# Patient Record
Sex: Female | Born: 2000 | Race: White | Hispanic: No | Marital: Single | Smoking: Never smoker
Health system: Southern US, Community
[De-identification: ages and names within clinical notes are randomized; demographics above are authoritative.]

## PROBLEM LIST (undated history)

## (undated) DIAGNOSIS — T7840XA Allergy, unspecified, initial encounter: Secondary | ICD-10-CM

## (undated) DIAGNOSIS — T8859XA Other complications of anesthesia, initial encounter: Secondary | ICD-10-CM

## (undated) DIAGNOSIS — G43909 Migraine, unspecified, not intractable, without status migrainosus: Secondary | ICD-10-CM

## (undated) HISTORY — DX: Allergy, unspecified, initial encounter: T78.40XA

## (undated) HISTORY — DX: Migraine, unspecified, not intractable, without status migrainosus: G43.909

---
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
## 2021-05-12 ENCOUNTER — Other Ambulatory Visit: Payer: Self-pay | Admitting: Family

## 2021-08-11 ENCOUNTER — Other Ambulatory Visit: Payer: Self-pay | Admitting: Internal Medicine

## 2021-08-11 NOTE — Telephone Encounter (Signed)
Requested Prescriptions  ?Pending Prescriptions Disp Refills  ?? omeprazole (PRILOSEC) 20 MG capsule [Pharmacy Med Name: OMEPRAZOLE DR 20 MG CAPSULE] 180 capsule 0  ?  Sig: TAKE 1 CAPSULE BY MOUTH TWICE A DAY  ?  ? There is no refill protocol information for this order  ?  ? ? ?

## 2021-10-28 ENCOUNTER — Other Ambulatory Visit: Payer: Self-pay | Admitting: Family

## 2021-11-10 ENCOUNTER — Other Ambulatory Visit: Payer: Self-pay | Admitting: Internal Medicine

## 2021-11-11 NOTE — Telephone Encounter (Signed)
Requested Prescriptions  Pending Prescriptions Disp Refills  . omeprazole (PRILOSEC) 20 MG capsule [Pharmacy Med Name: OMEPRAZOLE DR 20 MG CAPSULE] 180 capsule 0    Sig: TAKE 1 CAPSULE BY MOUTH TWICE A DAY     Gastroenterology: Proton Pump Inhibitors Failed - 11/10/2021  3:53 PM      Failed - Valid encounter within last 12 months    Recent Outpatient Visits          9 years ago Pain of sternum   [facility] at [facility], Gwenlyn Found, MD   10 years ago Impetigo   [facility] at [facility], Tessa Lerner, MD      Future Appointments            In 2 weeks Baity, Salvadore Oxford, NP [facility], [facility]           Patient must keep upcoming appointment for further refills.

## 2021-11-18 ENCOUNTER — Other Ambulatory Visit: Payer: Self-pay | Admitting: Internal Medicine

## 2021-11-19 NOTE — Telephone Encounter (Signed)
rx was sent to pharmacy on 11/11/21 #30/0 Requested Prescriptions  Pending Prescriptions Disp Refills  . omeprazole (PRILOSEC) 20 MG capsule [Pharmacy Med Name: OMEPRAZOLE DR 20 MG CAPSULE] 30 capsule 0    Sig: TAKE 1 CAPSULE BY MOUTH TWICE A DAY     Gastroenterology: Proton Pump Inhibitors Failed - 11/18/2021  2:31 PM      Failed - Valid encounter within last 12 months    Recent Outpatient Visits          9 years ago Pain of sternum   Primary Care at [facility], Gwenlyn Found, MD   10 years ago Impetigo   [facility] at [facility], Tessa Lerner, MD      Future Appointments            In 6 days Baity, Salvadore Oxford, NP [facility], [facility]

## 2021-11-25 ENCOUNTER — Encounter: Payer: Self-pay | Admitting: Internal Medicine

## 2021-11-25 ENCOUNTER — Ambulatory Visit (INDEPENDENT_AMBULATORY_CARE_PROVIDER_SITE_OTHER): Payer: No Typology Code available for payment source | Admitting: Internal Medicine

## 2021-11-25 VITALS — BP 128/86 | HR 75 | Temp 98.5°F | Ht <= 58 in | Wt 141.0 lb

## 2021-11-25 DIAGNOSIS — R5383 Other fatigue: Secondary | ICD-10-CM

## 2021-11-25 DIAGNOSIS — R4689 Other symptoms and signs involving appearance and behavior: Secondary | ICD-10-CM

## 2021-11-25 DIAGNOSIS — E663 Overweight: Secondary | ICD-10-CM | POA: Diagnosis not present

## 2021-11-25 DIAGNOSIS — R41840 Attention and concentration deficit: Secondary | ICD-10-CM

## 2021-11-25 DIAGNOSIS — F419 Anxiety disorder, unspecified: Secondary | ICD-10-CM | POA: Diagnosis not present

## 2021-11-25 DIAGNOSIS — G43C1 Periodic headache syndromes in child or adult, intractable: Secondary | ICD-10-CM

## 2021-11-25 DIAGNOSIS — K200 Eosinophilic esophagitis: Secondary | ICD-10-CM

## 2021-11-25 DIAGNOSIS — E6609 Other obesity due to excess calories: Secondary | ICD-10-CM | POA: Insufficient documentation

## 2021-11-25 DIAGNOSIS — Z0001 Encounter for general adult medical examination with abnormal findings: Secondary | ICD-10-CM | POA: Diagnosis not present

## 2021-11-25 DIAGNOSIS — Z6829 Body mass index (BMI) 29.0-29.9, adult: Secondary | ICD-10-CM

## 2021-11-25 DIAGNOSIS — F32A Depression, unspecified: Secondary | ICD-10-CM

## 2021-11-25 MED ORDER — FLUOXETINE HCL 10 MG PO CAPS: 10.0000 mg | ORAL_CAPSULE | Freq: Every day | ORAL | 2 refills | Status: DC

## 2021-11-25 MED ORDER — NORGESTIM-ETH ESTRAD TRIPHASIC 0.18/0.215/0.25 MG-35 MCG PO TABS: 1.0000 | ORAL_TABLET | Freq: Every day | ORAL | 1 refills | Status: DC

## 2021-11-25 NOTE — Progress Notes (Signed)
Subjective:    Patient ID: Michele Dunlap, female    DOB: 07-03-2000, 21 y.o.   MRN: [id number]  HPI  Patient presents to clinic today for her annual exam.  She is also due to follow-up chronic conditions.  Migraines: She thinks these are being triggered by her periods.  They are worse around her menstrual cycle.  She is not currently taking any medications for this.  Eosinophilic esophagitis: She reports chronic burning sensation in her abdomen.  She reports she is taking omeprazole but that it is not effective.  She would like referral to GI.  She also reports persistent anxiety, depression, compulsive behaviors.  She is concerned that she may have ADD due to lack of motivation, inattention and difficulty focusing.  She was taking Hydroxyzine as needed but is not currently taking this.  She is not currently seeing a therapist.  She denies SI/HI.  Flu: 01/2021 Tetanus: < 10 years ago COVID: never Dentist: biannually  Diet: She does eat meat. She consumes fruits and veggies. She does eat some fried foods. She drinks mostly water. Exercise: Gym 4-5 days per week  Review of Systems     Past Medical History:  Diagnosis Date   Allergy    Migraine     Current Outpatient Medications  Medication Sig Dispense Refill   amitriptyline (ELAVIL) 10 MG tablet TAKE 1 TABLET BY MOUTH EVERY DAY AT BEDTIME AS NEEDED FOR SLEEP 90 tablet 0   hydrOXYzine (ATARAX/VISTARIL) 25 MG tablet SMARTSIG:0.5-1 Tablet(s) By Mouth PRN PRN     [facility] 1/35 1-35 MG-MCG tablet TAKE 1 TABLET BY MOUTH EVERY DAY 84 tablet 3   omeprazole (PRILOSEC) 20 MG capsule TAKE 1 CAPSULE BY MOUTH TWICE A DAY 30 capsule 0   triamcinolone (KENALOG) 0.1 % Apply 1 application topically 2 (two) times daily. 30 g 0   No current facility-administered medications for this visit.    No Known Allergies  Family History  Problem Relation Age of Onset   Hypertension Maternal Grandmother    Hypertension Maternal Grandfather     Hypertension Paternal Grandmother    Hypertension Paternal Grandfather     Social History   Socioeconomic History   Marital status: Single    Spouse name: Not on file   Number of children: Not on file   Years of education: Not on file   Highest education level: Not on file  Occupational History   Not on file  Tobacco Use   Smoking status: Never   Smokeless tobacco: Never  Vaping Use   Vaping Use: Never used  Substance and Sexual Activity   Alcohol use: Never   Drug use: Never   Sexual activity: Yes  Other Topics Concern   Not on file  Social History Narrative   Leathia is currently in 11th grade at [employer]. She lives at home with mother and sister. She has 3 dogs. She likes to dance.   Social Determinants of Health   Financial Resource Strain: Not on file  Food Insecurity: Not on file  Transportation Needs: Not on file  Physical Activity: Not on file  Stress: Not on file  Social Connections: Not on file  Intimate Partner Violence: Not on file     Constitutional: Patient reports intermittent headaches, faitgue.  Denies fever, malaise, or abrupt weight changes.  HEENT: Denies eye pain, eye redness, ear pain, ringing in the ears, wax buildup, runny nose, nasal congestion, bloody nose, or sore throat. Respiratory: Denies difficulty breathing, shortness of breath,  cough or sputum production.   Cardiovascular: Denies chest pain, chest tightness, palpitations or swelling in the hands or feet.  Gastrointestinal: Pt report burning sensation in abdomen. Denies bloating, constipation, diarrhea or blood in the stool.  GU: Denies urgency, frequency, pain with urination, burning sensation, blood in urine, odor or discharge. Musculoskeletal: Denies decrease in range of motion, difficulty with gait, muscle pain or joint pain and swelling.  Skin: Denies redness, rashes, lesions or ulcercations.  Neurological: Pt reports inattention. Denies dizziness, difficulty with memory,  difficulty with speech or problems with balance and coordination.  Psych: Patient has a history of anxiety and depression, compulsive behavior.  Denies anxiety, SI/HI.  No other specific complaints in a complete review of systems (except as listed in HPI above).  Objective:   Physical Exam  BP 128/86 (BP Location: Left Arm, Patient Position: Sitting, Cuff Size: Normal)   Pulse 75   Temp 98.5 F (36.9 C) (Oral)   Ht $R'4\' 10"'Wx$  (1.473 m)   Wt 141 lb (64 kg)   SpO2 98%   BMI 29.47 kg/m   Wt Readings from Last 3 Encounters:  07/01/20 135 lb (61.2 kg) (66 %, Z= 0.41)*  03/17/20 134 lb (60.8 kg) (66 %, Z= 0.41)*  01/24/20 136 lb 6.4 oz (61.9 kg) (70 %, Z= 0.52)*   * Growth percentiles are based on CDC (Girls, 2-20 Years) data.    General: Appears her stated age, overweight, in NAD. Skin: Warm, dry and intact. No rashes, lesions or ulcerations noted. HEENT: Head: normal shape and size; Eyes: sclera white, no icterus, conjunctiva pink, PERRLA and EOMs intact;  Neck:  Neck supple, trachea midline. No masses, lumps or thyromegaly present.  Cardiovascular: Normal rate and rhythm. S1,S2 noted.  No murmur, rubs or gallops noted. No JVD or BLE edema Pulmonary/Chest: Normal effort and positive vesicular breath sounds. No respiratory distress. No wheezes, rales or ronchi noted.  Abdomen: Soft and nontender. Normal bowel sounds.  Musculoskeletal: strength 5/5 BUE/BLE. No difficulty with gait.  Neurological: Alert and oriented. Cranial nerves II-XII grossly intact. Coordination normal.  Psychiatric: Mood and affect normal. Behavior is normal. Judgment and thought content normal.    BMET    Component Value Date/Time   NA 135 02/02/2018 0039   K 3.6 02/02/2018 0039   CL 106 02/02/2018 0039   CO2 17 (L) 02/02/2018 0039   GLUCOSE 114 (H) 02/02/2018 0039   BUN 10 02/02/2018 0039   CREATININE 0.75 02/02/2018 0039   CALCIUM 8.7 (L) 02/02/2018 0039   GFRNONAA NOT CALCULATED 02/02/2018 0039    GFRAA NOT CALCULATED 02/02/2018 0039    Lipid Panel  No results found for: "CHOL", "TRIG", "HDL", "CHOLHDL", "VLDL", "LDLCALC"  CBC    Component Value Date/Time   WBC 19.7 (H) 02/02/2018 0039   RBC 4.73 02/02/2018 0039   HGB 13.7 02/02/2018 0039   HCT 41.3 02/02/2018 0039   PLT 192 02/02/2018 0039   MCV 87.3 02/02/2018 0039   MCH 29.0 02/02/2018 0039   MCHC 33.2 02/02/2018 0039   RDW 12.4 02/02/2018 0039   LYMPHSABS 1.8 02/02/2018 0039   MONOABS 1.7 (H) 02/02/2018 0039   EOSABS 0.1 02/02/2018 0039   BASOSABS 0.1 02/02/2018 0039    Hgb A1C No results found for: "HGBA1C"         Assessment & Plan:   Preventative Health Maintenance:  Encouraged her to get a flu shot in the fall Tetanus likely UTD given age Encouraged her to get a COVID-vaccine We will  start screening for cervical cancer 22 Encouraged her to consume a balanced diet and exercise regimen Advised her to see an eye doctor and dentist annually We will check CBC, c-Met, lipid, A1c,   Fatigue:  Will check TSH, Vit D and B12  Compulsive Behaviors, Inattention:  She will call [place] attention specialist for ADD testing  RTC in 6 months, follow-up chronic conditions [name], NP

## 2021-11-25 NOTE — Assessment & Plan Note (Signed)
We will trial fluoxetine 10 mg daily Support offered

## 2021-11-25 NOTE — Patient Instructions (Signed)

## 2021-11-25 NOTE — Assessment & Plan Note (Signed)
Encourage diet and exercise for weight loss 

## 2021-11-26 ENCOUNTER — Encounter: Payer: Self-pay | Admitting: Internal Medicine

## 2021-11-26 LAB — LIPID PANEL
Cholesterol: 190 mg/dL (ref <200)
HDL: 60 mg/dL (ref >=50)
LDL Cholesterol (Calc): 109 mg/dL (calc) — ABNORMAL HIGH
Non-HDL Cholesterol (Calc): 130 mg/dL (calc) — ABNORMAL HIGH (ref <130)
Total CHOL/HDL Ratio: 3.2 (calc) (ref <5.0)
Triglycerides: 100 mg/dL (ref <150)

## 2021-11-26 LAB — COMPLETE METABOLIC PANEL WITH GFR
AG Ratio: 1.6 (calc) (ref 1.0–2.5)
ALT: 13 U/L (ref 6–29)
AST: 17 U/L (ref 10–30)
Albumin: 4.6 g/dL (ref 3.6–5.1)
Alkaline phosphatase (APISO): 83 U/L (ref 31–125)
BUN: 11 mg/dL (ref 7–25)
CO2: 23 mmol/L (ref 20–32)
Calcium: 9.6 mg/dL (ref 8.6–10.2)
Chloride: 105 mmol/L (ref 98–110)
Creat: 0.73 mg/dL (ref 0.50–0.96)
Globulin: 2.8 g/dL (calc) (ref 1.9–3.7)
Glucose, Bld: 93 mg/dL (ref 65–99)
Potassium: 4.5 mmol/L (ref 3.5–5.3)
Sodium: 138 mmol/L (ref 135–146)
Total Bilirubin: 0.4 mg/dL (ref 0.2–1.2)
Total Protein: 7.4 g/dL (ref 6.1–8.1)
eGFR: 121 mL/min/{1.73_m2} (ref >=60)

## 2021-11-26 LAB — VITAMIN B12: Vitamin B-12: 274 pg/mL (ref 200–1100)

## 2021-11-26 LAB — TSH: TSH: 2.55 mIU/L

## 2021-11-26 LAB — CBC
HCT: 41.7 % (ref 35.0–45.0)
Hemoglobin: 13.7 g/dL (ref 11.7–15.5)
MCH: 29.1 pg (ref 27.0–33.0)
MCHC: 32.9 g/dL (ref 32.0–36.0)
MCV: 88.5 fL (ref 80.0–100.0)
MPV: 9.8 fL (ref 7.5–12.5)
Platelets: 305 10*3/uL (ref 140–400)
RBC: 4.71 10*6/uL (ref 3.80–5.10)
RDW: 12.3 % (ref 11.0–15.0)
WBC: 8.8 10*3/uL (ref 3.8–10.8)

## 2021-11-26 LAB — IRON,TIBC AND FERRITIN PANEL
%SAT: 15 % (calc) — ABNORMAL LOW (ref 16–45)
Ferritin: 39 ng/mL (ref 16–154)
Iron: 64 ug/dL (ref 40–190)
TIBC: 420 mcg/dL (calc) (ref 250–450)

## 2021-11-26 LAB — HEMOGLOBIN A1C
Hgb A1c MFr Bld: 4.9 % of total Hgb (ref <5.7)
Mean Plasma Glucose: 94 mg/dL
eAG (mmol/L): 5.2 mmol/L

## 2021-11-26 LAB — VITAMIN D 25 HYDROXY (VIT D DEFICIENCY, FRACTURES): Vit D, 25-Hydroxy: 34 ng/mL (ref 30–100)

## 2021-12-01 ENCOUNTER — Ambulatory Visit
Admission: EM | Admit: 2021-12-01 | Discharge: 2021-12-01 | Disposition: A | Discharge status: Home / Self Care | Payer: No Typology Code available for payment source | Attending: Family Medicine | Admitting: Family Medicine

## 2021-12-01 ENCOUNTER — Other Ambulatory Visit: Payer: Self-pay

## 2021-12-01 ENCOUNTER — Encounter: Payer: Self-pay | Admitting: Emergency Medicine

## 2021-12-01 DIAGNOSIS — L03039 Cellulitis of unspecified toe: Secondary | ICD-10-CM | POA: Diagnosis not present

## 2021-12-01 NOTE — Discharge Instructions (Addendum)
Perform warm soapy water soaks 4-6 times daily.  Apply gentle pressure to be sure that all the green discharge is removed from the toe.  Apply a small amount of topical antibiotics (provided).

## 2021-12-01 NOTE — ED Triage Notes (Signed)
Pt c/o left great toe pain. Started about a week ago. Pt has an ingrown toe nail. She states it is draining.

## 2021-12-01 NOTE — ED Provider Notes (Signed)
[facility]    CSN: [id number] Arrival date & time: 12/01/21  1335      History   Chief Complaint Chief Complaint  Patient presents with   Toe Pain    Left great toe     HPI Michele Dunlap is a 21 y.o. female.   HPI  Michele Dunlap here for left great toe pain that started about a week ago.  States that she tried to cut her nail down and think it is an ingrown toenail.  Says that there is some drainage from the area.  Pain worse with palpation.  She has not had any fevers or chills.  No nausea or vomiting.  No other symptoms or concerns today.   Past Medical History:  Diagnosis Date   Allergy    Migraine     Patient Active Problem List   Diagnosis Date Noted   Overweight with body mass index (BMI) of 29 to 29.9 in adult 11/25/2021   Anxiety and depression 03/18/2020   Eosinophilic esophagitis 02/12/2018   Migraine 02/27/2012    History reviewed. No pertinent surgical history.  OB History   No obstetric history on file.      Home Medications    Prior to Admission medications   Medication Sig Start Date End Date Taking? Authorizing Provider  FLUoxetine (PROZAC) 10 MG capsule Take 1 capsule (10 mg total) by mouth daily. 11/25/21  Yes Baity, Salvadore Oxford, NP  Norgestimate-Ethinyl Estradiol Triphasic (TRI-SPRINTEC) 0.18/0.215/0.25 MG-35 MCG tablet Take 1 tablet by mouth daily. 11/25/21  Yes Baity, Salvadore Oxford, NP    Family History Family History  Problem Relation Age of Onset   Hypertension Mother    Healthy Father    Down syndrome Sister    Hypertension Maternal Grandmother    Hypertension Maternal Grandfather    Hypertension Paternal Grandmother    Hypertension Paternal Grandfather     Social History Social History   Tobacco Use   Smoking status: Never   Smokeless tobacco: Never  Vaping Use   Vaping Use: Never used  Substance Use Topics   Alcohol use: Never   Drug use: Never     Allergies   Patient has no known allergies.   Review of  Systems Review of Systems : :negative unless otherwise stated in HPI.      Physical Exam Triage Vital Signs ED Triage Vitals  Enc Vitals Group     BP 12/01/21 1356 112/75     Pulse Rate 12/01/21 1356 74     Resp 12/01/21 1356 14     Temp 12/01/21 1356 98.4 F (36.9 C)     Temp Source 12/01/21 1356 Oral     SpO2 12/01/21 1356 97 %     Weight 12/01/21 1355 141 lb 1.5 oz (64 kg)     Height 12/01/21 1355 4\' 10"  (1.473 m)     Head Circumference --      Peak Flow --      Pain Score 12/01/21 1354 4     Pain Loc --      Pain Edu? --      Excl. in GC? --    No data found.  Updated Vital Signs BP 112/75 (BP Location: Left Arm)   Pulse 74   Temp 98.4 F (36.9 C) (Oral)   Resp 14   Ht 4\' 10"  (1.473 m)   Wt 64 kg   LMP 11/11/2021 (Approximate)   SpO2 97%   BMI 29.49 kg/m   Visual Acuity  Right Eye Distance:   Left Eye Distance:   Bilateral Distance:    Right Eye Near:   Left Eye Near:    Bilateral Near:     Physical Exam  GEN: pleasant well appearing female, in no acute distress  CV: regular rate  RESP: no increased work of breathing MSK: left great toe good ROM SKIN: warm, dry, erythematous at lateral border of left great toe, green purulent discharge expressed, curved nails on great toe  UC Treatments / Results  Labs (all labs ordered are listed, but only abnormal results are displayed) Labs Reviewed - No data to display  EKG   Radiology No results found.  Procedures Incision and Drainage  Date/Time: 12/01/2021 8:50 PM  Performed by: Katha Cabal, DO Authorized by: Katha Cabal, DO   Consent:    Consent obtained:  Verbal   Consent given by:  Patient   Risks, benefits, and alternatives were discussed: yes     Risks discussed:  Bleeding, pain and infection   Alternatives discussed:  No treatment and alternative treatment Location:    Type:  Fluid collection   Location:  Lower extremity   Lower extremity location:  Toe   Toe location:  L big  toe Pre-procedure details:    Skin preparation:  Antiseptic wash Sedation:    Sedation type:  None Anesthesia:    Anesthesia method:  None Procedure type:    Complexity:  Simple Procedure details:    Incision types:  Stab incision   Drainage:  Purulent   Drainage amount:  Scant   Packing materials:  None Post-procedure details:    Procedure completion:  Tolerated   Medications Ordered in UC Medications - No data to display  Initial Impression / Assessment and Plan / UC Course  I have reviewed the triage vital signs and the nursing notes.  Pertinent labs & imaging results that were available during my care of the patient were reviewed by me and considered in my medical decision making (see chart for details).     Paronychia Pt removed her own ingrown toenail prior to arrival. Exam consistent with paronychia.  I&D performed and patient tolerated well.  Topical antibiotics applied.  Patient to perform warm soaks apply topical antibiotics several times a day.  Red flag symptoms discussed.  Return precautions provided  Discussed MDM, treatment plan and plan for follow-up with patient who agrees with plan.   Final Clinical Impressions(s) / UC Diagnoses   Final diagnoses:  Paronychia of great toe     Discharge Instructions      Perform warm soapy water soaks 4-6 times daily.  Apply gentle pressure to be sure that all the green discharge is removed from the toe.  Apply a small amount of topical antibiotics (provided).       ED Prescriptions   None    PDMP not reviewed this encounter.   Katha Cabal, DO 12/01/21 2055

## 2021-12-02 ENCOUNTER — Telehealth (INDEPENDENT_AMBULATORY_CARE_PROVIDER_SITE_OTHER): Payer: No Typology Code available for payment source | Admitting: Internal Medicine

## 2021-12-02 ENCOUNTER — Encounter: Payer: Self-pay | Admitting: Internal Medicine

## 2021-12-02 DIAGNOSIS — Z6829 Body mass index (BMI) 29.0-29.9, adult: Secondary | ICD-10-CM | POA: Diagnosis not present

## 2021-12-02 DIAGNOSIS — E663 Overweight: Secondary | ICD-10-CM | POA: Diagnosis not present

## 2021-12-02 NOTE — Patient Instructions (Signed)
Calorie Counting for Weight Loss Calories are units of energy. Your body needs a certain number of calories from food to keep going throughout the day. When you eat or drink more calories than your body needs, your body stores the extra calories mostly as fat. When you eat or drink fewer calories than your body needs, your body burns fat to get the energy it needs. Calorie counting means keeping track of how many calories you eat and drink each day. Calorie counting can be helpful if you need to lose weight. If you eat fewer calories than your body needs, you should lose weight. Ask your health care provider what a healthy weight is for you. For calorie counting to work, you will need to eat the right number of calories each day to lose a healthy amount of weight per week. A dietitian can help you figure out how many calories you need in a day and will suggest ways to reach your calorie goal. A healthy amount of weight to lose each week is usually 1-2 lb (0.5-0.9 kg). This usually means that your daily calorie intake should be reduced by 500-750 calories. Eating 1,200-1,500 calories a day can help most women lose weight. Eating 1,500-1,800 calories a day can help most men lose weight. What do I need to know about calorie counting? Work with your health care provider or dietitian to determine how many calories you should get each day. To meet your daily calorie goal, you will need to: Find out how many calories are in each food that you would like to eat. Try to do this before you eat. Decide how much of the food you plan to eat. Keep a food log. Do this by writing down what you ate and how many calories it had. To successfully lose weight, it is important to balance calorie counting with a healthy lifestyle that includes regular activity. Where do I find calorie information?  The number of calories in a food can be found on a Nutrition Facts label. If a food does not have a Nutrition Facts label, try  to look up the calories online or ask your dietitian for help. Remember that calories are listed per serving. If you choose to have more than one serving of a food, you will have to multiply the calories per serving by the number of servings you plan to eat. For example, the label on a package of bread might say that a serving size is 1 slice and that there are 90 calories in a serving. If you eat 1 slice, you will have eaten 90 calories. If you eat 2 slices, you will have eaten 180 calories. How do I keep a food log? After each time that you eat, record the following in your food log as soon as possible: What you ate. Be sure to include toppings, sauces, and other extras on the food. How much you ate. This can be measured in cups, ounces, or number of items. How many calories were in each food and drink. The total number of calories in the food you ate. Keep your food log near you, such as in a pocket-sized notebook or on an app or website on your mobile phone. Some programs will calculate calories for you and show you how many calories you have left to meet your daily goal. What are some portion-control tips? Know how many calories are in a serving. This will help you know how many servings you can have of a certain   food. Use a measuring cup to measure serving sizes. You could also try weighing out portions on a kitchen scale. With time, you will be able to estimate serving sizes for some foods. Take time to put servings of different foods on your favorite plates or in your favorite bowls and cups so you know what a serving looks like. Try not to eat straight from a food's packaging, such as from a bag or box. Eating straight from the package makes it hard to see how much you are eating and can lead to overeating. Put the amount you would like to eat in a cup or on a plate to make sure you are eating the right portion. Use smaller plates, glasses, and bowls for smaller portions and to prevent  overeating. Try not to multitask. For example, avoid watching TV or using your computer while eating. If it is time to eat, sit down at a table and enjoy your food. This will help you recognize when you are full. It will also help you be more mindful of what and how much you are eating. What are tips for following this plan? Reading food labels Check the calorie count compared with the serving size. The serving size may be smaller than what you are used to eating. Check the source of the calories. Try to choose foods that are high in protein, fiber, and vitamins, and low in saturated fat, trans fat, and sodium. Shopping Read nutrition labels while you shop. This will help you make healthy decisions about which foods to buy. Pay attention to nutrition labels for low-fat or fat-free foods. These foods sometimes have the same number of calories or more calories than the full-fat versions. They also often have added sugar, starch, or salt to make up for flavor that was removed with the fat. Make a grocery list of lower-calorie foods and stick to it. Cooking Try to cook your favorite foods in a healthier way. For example, try baking instead of frying. Use low-fat dairy products. Meal planning Use more fruits and vegetables. One-half of your plate should be fruits and vegetables. Include lean proteins, such as chicken, turkey, and fish. Lifestyle Each week, aim to do one of the following: 150 minutes of moderate exercise, such as walking. 75 minutes of vigorous exercise, such as running. General information Know how many calories are in the foods you eat most often. This will help you calculate calorie counts faster. Find a way of tracking calories that works for you. Get creative. Try different apps or programs if writing down calories does not work for you. What foods should I eat?  Eat nutritious foods. It is better to have a nutritious, high-calorie food, such as an avocado, than a food with  few nutrients, such as a bag of potato chips. Use your calories on foods and drinks that will fill you up and will not leave you hungry soon after eating. Examples of foods that fill you up are nuts and nut butters, vegetables, lean proteins, and high-fiber foods such as whole grains. High-fiber foods are foods with more than 5 g of fiber per serving. Pay attention to calories in drinks. Low-calorie drinks include water and unsweetened drinks. The items listed above may not be a complete list of foods and beverages you can eat. Contact a dietitian for more information. What foods should I limit? Limit foods or drinks that are not good sources of vitamins, minerals, or protein or that are high in unhealthy fats. These   include: Candy. Other sweets. Sodas, specialty coffee drinks, alcohol, and juice. The items listed above may not be a complete list of foods and beverages you should avoid. Contact a dietitian for more information. How do I count calories when eating out? Pay attention to portions. Often, portions are much larger when eating out. Try these tips to keep portions smaller: Consider sharing a meal instead of getting your own. If you get your own meal, eat only half of it. Before you start eating, ask for a container and put half of your meal into it. When available, consider ordering smaller portions from the menu instead of full portions. Pay attention to your food and drink choices. Knowing the way food is cooked and what is included with the meal can help you eat fewer calories. If calories are listed on the menu, choose the lower-calorie options. Choose dishes that include vegetables, fruits, whole grains, low-fat dairy products, and lean proteins. Choose items that are boiled, broiled, grilled, or steamed. Avoid items that are buttered, battered, fried, or served with cream sauce. Items labeled as crispy are usually fried, unless stated otherwise. Choose water, low-fat milk,  unsweetened iced tea, or other drinks without added sugar. If you want an alcoholic beverage, choose a lower-calorie option, such as a glass of wine or light beer. Ask for dressings, sauces, and syrups on the side. These are usually high in calories, so you should limit the amount you eat. If you want a salad, choose a garden salad and ask for grilled meats. Avoid extra toppings such as bacon, cheese, or fried items. Ask for the dressing on the side, or ask for olive oil and vinegar or lemon to use as dressing. Estimate how many servings of a food you are given. Knowing serving sizes will help you be aware of how much food you are eating at restaurants. Where to find more information Centers for Disease Control and Prevention: www.cdc.gov U.[place]. Department of Agriculture: myplate.gov Summary Calorie counting means keeping track of how many calories you eat and drink each day. If you eat fewer calories than your body needs, you should lose weight. A healthy amount of weight to lose per week is usually 1-2 lb (0.5-0.9 kg). This usually means reducing your daily calorie intake by 500-750 calories. The number of calories in a food can be found on a Nutrition Facts label. If a food does not have a Nutrition Facts label, try to look up the calories online or ask your dietitian for help. Use smaller plates, glasses, and bowls for smaller portions and to prevent overeating. Use your calories on foods and drinks that will fill you up and not leave you hungry shortly after a meal. This information is not intended to replace advice given to you by your health care provider. Make sure you discuss any questions you have with your health care provider. Document Revised: 05/23/2019 Document Reviewed: 05/23/2019 Elsevier Patient Education  2023 Elsevier Inc.  

## 2021-12-02 NOTE — Progress Notes (Signed)
Virtual Visit via Video Note  I connected with Michele Dunlap on 12/02/21 at 11:20 AM EDT by a video enabled telemedicine application and verified that I am speaking with the correct person using two identifiers.  Location: Patient: Home Provider: Office  Persons participating in this video call: [name], NP and Michele Dunlap.   I discussed the limitations of evaluation and management by telemedicine and the availability of in person appointments. The patient expressed understanding and agreed to proceed.  History of Present Illness:  Patient presents to clinic today requesting medication for weight loss.  Her current weight is 141 pounds with a BMI of 29.49.  She has been working out at the gym 2 to 3 days a week.  She has been trying to consume smaller portions.  Past Medical History:  Diagnosis Date   Allergy    Migraine     Current Outpatient Medications  Medication Sig Dispense Refill   FLUoxetine (PROZAC) 10 MG capsule Take 1 capsule (10 mg total) by mouth daily. 30 capsule 2   Norgestimate-Ethinyl Estradiol Triphasic (TRI-SPRINTEC) 0.18/0.215/0.25 MG-35 MCG tablet Take 1 tablet by mouth daily. 84 tablet 1   No current facility-administered medications for this visit.    No Known Allergies  Family History  Problem Relation Age of Onset   Hypertension Mother    Healthy Father    Down syndrome Sister    Hypertension Maternal Grandmother    Hypertension Maternal Grandfather    Hypertension Paternal Grandmother    Hypertension Paternal Grandfather     Social History   Socioeconomic History   Marital status: Single    Spouse name: Not on file   Number of children: Not on file   Years of education: Not on file   Highest education level: Not on file  Occupational History   Not on file  Tobacco Use   Smoking status: Never   Smokeless tobacco: Never  Vaping Use   Vaping Use: Never used  Substance and Sexual Activity   Alcohol use: Never   Drug use: Never    Sexual activity: Yes  Other Topics Concern   Not on file  Social History Narrative   Jalasia is currently in 11th grade at [employer]. She lives at home with mother and sister. She has 3 dogs. She likes to dance.   Social Determinants of Health   Financial Resource Strain: Not on file  Food Insecurity: Not on file  Transportation Needs: Not on file  Physical Activity: Not on file  Stress: Not on file  Social Connections: Not on file  Intimate Partner Violence: Not on file     Constitutional: Denies fever, malaise, fatigue, headache or abrupt weight changes.  Respiratory: Denies difficulty breathing, shortness of breath, cough or sputum production.   Cardiovascular: Denies chest pain, chest tightness, palpitations or swelling in the hands or feet.  Gastrointestinal: Denies abdominal pain, bloating, constipation, diarrhea or blood in the stool.   No other specific complaints in a complete review of systems (except as listed in HPI above).    Observations/Objective:  Wt Readings from Last 3 Encounters:  12/01/21 141 lb 1.5 oz (64 kg)  11/25/21 141 lb (64 kg)  07/01/20 135 lb (61.2 kg) (66 %, Z= 0.41)*   * Growth percentiles are based on CDC (Girls, 2-20 Years) data.    General: Appears her stated age, overweight, in NAD. Pulmonary/Chest: Normal effort. No respiratory distress.  Neurological: Alert and oriented.   BMET    Component Value  Date/Time   NA 138 11/25/2021 1018   K 4.5 11/25/2021 1018   CL 105 11/25/2021 1018   CO2 23 11/25/2021 1018   GLUCOSE 93 11/25/2021 1018   BUN 11 11/25/2021 1018   CREATININE 0.73 11/25/2021 1018   CALCIUM 9.6 11/25/2021 1018   GFRNONAA NOT CALCULATED 02/02/2018 0039   GFRAA NOT CALCULATED 02/02/2018 0039    Lipid Panel     Component Value Date/Time   CHOL 190 11/25/2021 1018   TRIG 100 11/25/2021 1018   HDL 60 11/25/2021 1018   CHOLHDL 3.2 11/25/2021 1018   LDLCALC 109 (H) 11/25/2021 1018    CBC    Component Value  Date/Time   WBC 8.8 11/25/2021 1018   RBC 4.71 11/25/2021 1018   HGB 13.7 11/25/2021 1018   HCT 41.7 11/25/2021 1018   PLT 305 11/25/2021 1018   MCV 88.5 11/25/2021 1018   MCH 29.1 11/25/2021 1018   MCHC 32.9 11/25/2021 1018   RDW 12.3 11/25/2021 1018   LYMPHSABS 1.8 02/02/2018 0039   MONOABS 1.7 (H) 02/02/2018 0039   EOSABS 0.1 02/02/2018 0039   BASOSABS 0.1 02/02/2018 0039    Hgb A1C Lab Results  Component Value Date   HGBA1C 4.9 11/25/2021       Assessment and Plan:  Overweight:  Discussed that she does not qualify for weight loss medications unless her BMI is >30 Encourage 1400-calorie restricted diet that consist mainly of lean protein and green vegetables Encourage 150 minutes of exercise weekly  Return precautions discussed  Follow Up Instructions:    I discussed the assessment and treatment plan with the patient. The patient was provided an opportunity to ask questions and all were answered. The patient agreed with the plan and demonstrated an understanding of the instructions.   The patient was advised to call back or seek an in-person evaluation if the symptoms worsen or if the condition fails to improve as anticipated.   [name], NP

## 2021-12-17 ENCOUNTER — Other Ambulatory Visit: Payer: Self-pay | Admitting: Internal Medicine

## 2021-12-17 DIAGNOSIS — Z0001 Encounter for general adult medical examination with abnormal findings: Secondary | ICD-10-CM

## 2021-12-17 NOTE — Telephone Encounter (Signed)
Requested medications are due for refill today.  no  Requested medications are on the active medications list.  yes  Last refill. 11/25/2021 #30 2 refills  Future visit scheduled.   yes  Notes to clinic.  Pharmacy needs dx code. Pt requesting 90 day supply.    Requested Prescriptions  Pending Prescriptions Disp Refills   FLUoxetine (PROZAC) 10 MG capsule [Pharmacy Med Name: FLUOXETINE HCL 10 MG CAPSULE] 90 capsule 1    Sig: TAKE 1 CAPSULE BY MOUTH EVERY DAY     Psychiatry:  Antidepressants - SSRI Passed - 12/17/2021 12:32 PM      Passed - Completed PHQ-2 or PHQ-9 in the last 360 days      Passed - Valid encounter within last 6 months    Recent Outpatient Visits           2 weeks ago Overweight with body mass index (BMI) of 29 to 29.9 in adult   [facility] [place], Salvadore Oxford, NP   3 weeks ago Encounter for general adult medical examination with abnormal findings   [facility] [place], Salvadore Oxford, NP   9 years ago Pain of sternum   [facility], Gwenlyn Found, MD   10 years ago Impetigo   [facility], Tessa Lerner, MD       Future Appointments             In 5 months Baity, Salvadore Oxford, NP [facility], [facility]

## 2021-12-21 ENCOUNTER — Encounter: Payer: Self-pay | Admitting: Internal Medicine

## 2021-12-23 ENCOUNTER — Other Ambulatory Visit: Payer: Self-pay | Admitting: Internal Medicine

## 2021-12-24 NOTE — Telephone Encounter (Signed)
Requested medication (s) are due for refill today: yes if pt is still taking  Requested medication (s) are on the active medication list:yes  Last refill:  unknown  Future visit scheduled yes  Notes to clinic:  Unable to refill per protocol, last refill by another provider. Historical medication, routing for review.     Requested Prescriptions  Pending Prescriptions Disp Refills   omeprazole (PRILOSEC) 20 MG capsule [Pharmacy Med Name: OMEPRAZOLE DR 20 MG CAPSULE] 30 capsule     Sig: TAKE 1 CAPSULE BY MOUTH TWICE A DAY     Gastroenterology: Proton Pump Inhibitors Passed - 12/23/2021  8:41 AM      Passed - Valid encounter within last 12 months    Recent Outpatient Visits           3 weeks ago Overweight with body mass index (BMI) of 29 to 29.9 in adult   [facility] [place], Salvadore Oxford, NP   4 weeks ago Encounter for general adult medical examination with abnormal findings   [facility] [place], Salvadore Oxford, NP   9 years ago Pain of sternum   [facility], Gwenlyn Found, MD   10 years ago Impetigo   [facility], Tessa Lerner, MD       Future Appointments             In 5 months Baity, Salvadore Oxford, NP [facility], [facility]

## 2022-01-04 ENCOUNTER — Telehealth: Payer: No Typology Code available for payment source | Admitting: Internal Medicine

## 2022-01-10 ENCOUNTER — Telehealth (INDEPENDENT_AMBULATORY_CARE_PROVIDER_SITE_OTHER): Payer: No Typology Code available for payment source | Admitting: Internal Medicine

## 2022-01-10 ENCOUNTER — Encounter: Payer: Self-pay | Admitting: Internal Medicine

## 2022-01-10 DIAGNOSIS — F32A Depression, unspecified: Secondary | ICD-10-CM | POA: Diagnosis not present

## 2022-01-10 DIAGNOSIS — F5104 Psychophysiologic insomnia: Secondary | ICD-10-CM | POA: Diagnosis not present

## 2022-01-10 DIAGNOSIS — G4700 Insomnia, unspecified: Secondary | ICD-10-CM | POA: Insufficient documentation

## 2022-01-10 DIAGNOSIS — F419 Anxiety disorder, unspecified: Secondary | ICD-10-CM

## 2022-01-10 MED ORDER — FLUOXETINE HCL 20 MG PO CAPS: 20.0000 mg | ORAL_CAPSULE | Freq: Every day | ORAL | 0 refills | Status: DC

## 2022-01-10 MED ORDER — TRAZODONE HCL 50 MG PO TABS: 25.0000 mg | ORAL_TABLET | Freq: Every evening | ORAL | 0 refills | Status: DC | PRN

## 2022-01-10 NOTE — Patient Instructions (Signed)
Insomnia Insomnia is a sleep disorder that makes it difficult to fall asleep or stay asleep. Insomnia can cause fatigue, low energy, difficulty concentrating, mood swings, and poor performance at work or school. There are three different ways to classify insomnia: Difficulty falling asleep. Difficulty staying asleep. Waking up too early in the morning. Any type of insomnia can be long-term (chronic) or short-term (acute). Both are common. Short-term insomnia usually lasts for 3 months or less. Chronic insomnia occurs at least three times a week for longer than 3 months. What are the causes? Insomnia may be caused by another condition, situation, or substance, such as: Having certain mental health conditions, such as anxiety and depression. Using caffeine, alcohol, tobacco, or drugs. Having gastrointestinal conditions, such as gastroesophageal reflux disease (GERD). Having certain medical conditions. These include: Asthma. Alzheimer's disease. Stroke. Chronic pain. An overactive thyroid gland (hyperthyroidism). Other sleep disorders, such as restless legs syndrome and sleep apnea. Menopause. Sometimes, the cause of insomnia may not be known. What increases the risk? Risk factors for insomnia include: Gender. Females are affected more often than males. Age. Insomnia is more common as people get older. Stress and certain medical and mental health conditions. Lack of exercise. Having an irregular work schedule. This may include working night shifts and traveling between different time zones. What are the signs or symptoms? If you have insomnia, the main symptom is having trouble falling asleep or having trouble staying asleep. This may lead to other symptoms, such as: Feeling tired or having low energy. Feeling nervous about going to sleep. Not feeling rested in the morning. Having trouble concentrating. Feeling irritable, anxious, or depressed. How is this diagnosed? This condition  may be diagnosed based on: Your symptoms and medical history. Your health care provider may ask about: Your sleep habits. Any medical conditions you have. Your mental health. A physical exam. How is this treated? Treatment for insomnia depends on the cause. Treatment may focus on treating an underlying condition that is causing the insomnia. Treatment may also include: Medicines to help you sleep. Counseling or therapy. Lifestyle adjustments to help you sleep better. Follow these instructions at home: Eating and drinking  Limit or avoid alcohol, caffeinated beverages, and products that contain nicotine and tobacco, especially close to bedtime. These can disrupt your sleep. Do not eat a large meal or eat spicy foods right before bedtime. This can lead to digestive discomfort that can make it hard for you to sleep. Sleep habits  Keep a sleep diary to help you and your health care provider figure out what could be causing your insomnia. Write down: When you sleep. When you wake up during the night. How well you sleep and how rested you feel the next day. Any side effects of medicines you are taking. What you eat and drink. Make your bedroom a dark, comfortable place where it is easy to fall asleep. Put up shades or blackout curtains to block light from outside. Use a white noise machine to block noise. Keep the temperature cool. Limit screen use before bedtime. This includes: Not watching TV. Not using your smartphone, tablet, or computer. Stick to a routine that includes going to bed and waking up at the same times every day and night. This can help you fall asleep faster. Consider making a quiet activity, such as reading, part of your nighttime routine. Try to avoid taking naps during the day so that you sleep better at night. Get out of bed if you are still awake after   15 minutes of trying to sleep. Keep the lights down, but try reading or doing a quiet activity. When you feel  sleepy, go back to bed. General instructions Take over-the-counter and prescription medicines only as told by your health care provider. Exercise regularly as told by your health care provider. However, avoid exercising in the hours right before bedtime. Use relaxation techniques to manage stress. Ask your health care provider to suggest some techniques that may work well for you. These may include: Breathing exercises. Routines to release muscle tension. Visualizing peaceful scenes. Make sure that you drive carefully. Do not drive if you feel very sleepy. Keep all follow-up visits. This is important. Contact a health care provider if: You are tired throughout the day. You have trouble in your daily routine due to sleepiness. You continue to have sleep problems, or your sleep problems get worse. Get help right away if: You have thoughts about hurting yourself or someone else. Get help right away if you feel like you may hurt yourself or others, or have thoughts about taking your own life. Go to your nearest emergency room or: Call 911. Call the National Suicide Prevention Lifeline at 1-800-273-8255 or 988. This is open 24 hours a day. Text the Crisis Text Line at 741741. Summary Insomnia is a sleep disorder that makes it difficult to fall asleep or stay asleep. Insomnia can be long-term (chronic) or short-term (acute). Treatment for insomnia depends on the cause. Treatment may focus on treating an underlying condition that is causing the insomnia. Keep a sleep diary to help you and your health care provider figure out what could be causing your insomnia. This information is not intended to replace advice given to you by your health care provider. Make sure you discuss any questions you have with your health care provider. Document Revised: 03/22/2021 Document Reviewed: 03/22/2021 Elsevier Patient Education  2023 [employer]vier Inc.  

## 2022-01-10 NOTE — Assessment & Plan Note (Addendum)
We will increase fluoxetine to 20 mg daily She is not currently seeing a therapist at this time Support offered

## 2022-01-10 NOTE — Assessment & Plan Note (Signed)
We will try trazodone 50 mg nightly-sedation caution given

## 2022-01-10 NOTE — Progress Notes (Signed)
Virtual Visit via Video Note  I connected with Michele Dunlap on 01/10/22 at  4:00 PM EDT by a video enabled telemedicine application and verified that I am speaking with the correct person using two identifiers.  Location: Patient: In her car Provider: Office  Persons participating in this video call: [name], NP and Michele Dunlap.   I discussed the limitations of evaluation and management by telemedicine and the availability of in person appointments. The patient expressed understanding and agreed to proceed.  History of Present Illness:  Patient wanted to follow-up on anxiety and depression.  This is currently managed on Fluoxetine which she started about 1 month ago.  She feels like the medication was effective for about 3 to 4 weeks but she has started noticing increased anxiety and difficulty sleeping.  She has difficulty falling asleep but is able to stay asleep.  She has taken Trazodone in the past and thinks maybe she would like to get restarted on this again.  She is not currently seeing a therapist.  She denies SI/HI.    Past Medical History:  Diagnosis Date   Allergy    Migraine     Current Outpatient Medications  Medication Sig Dispense Refill   FLUoxetine (PROZAC) 10 MG capsule TAKE 1 CAPSULE BY MOUTH EVERY DAY 90 capsule 1   Norgestimate-Ethinyl Estradiol Triphasic (TRI-SPRINTEC) 0.18/0.215/0.25 MG-35 MCG tablet Take 1 tablet by mouth daily. 84 tablet 1   omeprazole (PRILOSEC) 20 MG capsule TAKE 1 CAPSULE BY MOUTH TWICE A DAY 180 capsule 0   No current facility-administered medications for this visit.    No Known Allergies  Family History  Problem Relation Age of Onset   Hypertension Mother    Healthy Father    Down syndrome Sister    Hypertension Maternal Grandmother    Hypertension Maternal Grandfather    Hypertension Paternal Grandmother    Hypertension Paternal Grandfather     Social History   Socioeconomic History   Marital status: Single     Spouse name: Not on file   Number of children: Not on file   Years of education: Not on file   Highest education level: Not on file  Occupational History   Not on file  Tobacco Use   Smoking status: Never   Smokeless tobacco: Never  Vaping Use   Vaping Use: Never used  Substance and Sexual Activity   Alcohol use: Never   Drug use: Never   Sexual activity: Yes  Other Topics Concern   Not on file  Social History Narrative   Beza is currently in 11th grade at [employer]. She lives at home with mother and sister. She has 3 dogs. She likes to dance.   Social Determinants of Health   Financial Resource Strain: Not on file  Food Insecurity: Not on file  Transportation Needs: Not on file  Physical Activity: Not on file  Stress: Not on file  Social Connections: Not on file  Intimate Partner Violence: Not on file     Constitutional: Denies fever, malaise, fatigue, headache or abrupt weight changes.  HEENT: Denies eye pain, eye redness, ear pain, ringing in the ears, wax buildup, runny nose, nasal congestion, bloody nose, or sore throat. Respiratory: Denies difficulty breathing, shortness of breath, cough or sputum production.   Cardiovascular: Denies chest pain, chest tightness, palpitations or swelling in the hands or feet.  Gastrointestinal: Denies abdominal pain, bloating, constipation, diarrhea or blood in the stool.  GU: Denies urgency, frequency, pain with urination,  burning sensation, blood in urine, odor or discharge. Musculoskeletal: Denies decrease in range of motion, difficulty with gait, muscle pain or joint pain and swelling.  Skin: Denies redness, rashes, lesions or ulcercations.  Neurological: Patient reports insomnia.  Denies dizziness, difficulty with memory, difficulty with speech or problems with balance and coordination.  Psych: Patient has a history of anxiety and depression.  Denies SI/HI.  No other specific complaints in a complete review of systems (except  as listed in HPI above).  Observations/Objective: There were no vitals taken for this visit. Wt Readings from Last 3 Encounters:  12/01/21 141 lb 1.5 oz (64 kg)  11/25/21 141 lb (64 kg)  07/01/20 135 lb (61.2 kg) (66 %, Z= 0.41)*   * Growth percentiles are based on CDC (Girls, 2-20 Years) data.    General: Appears her stated age, well developed, well nourished in NAD. Pulmonary/Chest: Normal effort. No respiratory distress.  Psychiatric: Mood and affect normal.  Mildly anxious appearing. Judgment and thought content normal.     BMET    Component Value Date/Time   NA 138 11/25/2021 1018   K 4.5 11/25/2021 1018   CL 105 11/25/2021 1018   CO2 23 11/25/2021 1018   GLUCOSE 93 11/25/2021 1018   BUN 11 11/25/2021 1018   CREATININE 0.73 11/25/2021 1018   CALCIUM 9.6 11/25/2021 1018   GFRNONAA NOT CALCULATED 02/02/2018 0039   GFRAA NOT CALCULATED 02/02/2018 0039    Lipid Panel     Component Value Date/Time   CHOL 190 11/25/2021 1018   TRIG 100 11/25/2021 1018   HDL 60 11/25/2021 1018   CHOLHDL 3.2 11/25/2021 1018   LDLCALC 109 (H) 11/25/2021 1018    CBC    Component Value Date/Time   WBC 8.8 11/25/2021 1018   RBC 4.71 11/25/2021 1018   HGB 13.7 11/25/2021 1018   HCT 41.7 11/25/2021 1018   PLT 305 11/25/2021 1018   MCV 88.5 11/25/2021 1018   MCH 29.1 11/25/2021 1018   MCHC 32.9 11/25/2021 1018   RDW 12.3 11/25/2021 1018   LYMPHSABS 1.8 02/02/2018 0039   MONOABS 1.7 (H) 02/02/2018 0039   EOSABS 0.1 02/02/2018 0039   BASOSABS 0.1 02/02/2018 0039    Hgb A1C Lab Results  Component Value Date   HGBA1C 4.9 11/25/2021        Assessment and Plan:  RTC in 5 months, follow-up chronic conditions  Follow Up Instructions:    I discussed the assessment and treatment plan with the patient. The patient was provided an opportunity to ask questions and all were answered. The patient agreed with the plan and demonstrated an understanding of the instructions.   The  patient was advised to call back or seek an in-person evaluation if the symptoms worsen or if the condition fails to improve as anticipated.    [name], NP

## 2022-01-14 ENCOUNTER — Ambulatory Visit: Payer: No Typology Code available for payment source | Admitting: Podiatry

## 2022-01-20 ENCOUNTER — Ambulatory Visit
Admission: EM | Admit: 2022-01-20 | Discharge: 2022-01-20 | Disposition: A | Discharge status: Home / Self Care | Payer: No Typology Code available for payment source | Attending: Family Medicine | Admitting: Family Medicine

## 2022-01-20 DIAGNOSIS — Z1152 Encounter for screening for COVID-19: Secondary | ICD-10-CM | POA: Diagnosis not present

## 2022-01-20 DIAGNOSIS — R197 Diarrhea, unspecified: Secondary | ICD-10-CM | POA: Diagnosis not present

## 2022-01-20 DIAGNOSIS — R109 Unspecified abdominal pain: Secondary | ICD-10-CM | POA: Insufficient documentation

## 2022-01-20 DIAGNOSIS — R519 Headache, unspecified: Secondary | ICD-10-CM | POA: Diagnosis present

## 2022-01-20 DIAGNOSIS — B349 Viral infection, unspecified: Secondary | ICD-10-CM | POA: Diagnosis not present

## 2022-01-20 LAB — RESP PANEL BY RT-PCR (FLU A&B, COVID) ARPGX2
Influenza A by PCR: NEGATIVE
Influenza B by PCR: NEGATIVE
SARS Coronavirus 2 by RT PCR: NEGATIVE

## 2022-01-20 MED ORDER — ONDANSETRON HCL 4 MG PO TABS: 4.0000 mg | ORAL_TABLET | Freq: Four times a day (QID) | ORAL | 0 refills | Status: DC

## 2022-01-20 NOTE — ED Triage Notes (Signed)
Pt c/o chills, headache onset x3 days ago. Pt also reports some diarrhea and upset stomach

## 2022-01-20 NOTE — ED Provider Notes (Signed)
[facility]    CSN: [id number] Arrival date & time: 01/20/22  1532      History   Chief Complaint Chief Complaint  Patient presents with   Chills   Headache   Cough   Diarrhea    HPI Labresha Mellor is a 21 y.o. female.   HPI   Mylisa presents for headache, chills that started 3 days ago. Endorses cough, undocumented fever, abdominal pain, nasal congestion, sore throat. She has not been able to get much sleep to her symptoms.  Of note, her friend from school has similar symptoms.   Fever : unsure Chills: no Sore throat: yes  Cough: yes Sputum: no Nasal congestion : yes Rhinorrhea: yes Myalgias: yes Appetite: normal  Hydration: normal  Abdominal pain: yes Nausea: yes Vomiting: yes Diarrhea: yes Rash: no  Sleep disturbance: yes  Headache: yes      Past Medical History:  Diagnosis Date   Allergy    Migraine     Patient Active Problem List   Diagnosis Date Noted   Insomnia 01/10/2022   Overweight with body mass index (BMI) of 29 to 29.9 in adult 11/25/2021   Anxiety and depression 03/18/2020   Eosinophilic esophagitis 02/12/2018   Migraine 02/27/2012    History reviewed. No pertinent surgical history.  OB History   No obstetric history on file.      Home Medications    Prior to Admission medications   Medication Sig Start Date End Date Taking? Authorizing Provider  FLUoxetine (PROZAC) 20 MG capsule Take 1 capsule (20 mg total) by mouth daily. 01/10/22  Yes Baity, Salvadore Oxford, NP  ondansetron (ZOFRAN) 4 MG tablet Take 1 tablet (4 mg total) by mouth every 6 (six) hours. 01/20/22  Yes Byanka Landrus, Seward Meth, DO  Norgestimate-Ethinyl Estradiol Triphasic (TRI-SPRINTEC) 0.18/0.215/0.25 MG-35 MCG tablet Take 1 tablet by mouth daily. 11/25/21   Lorre Munroe, NP  omeprazole (PRILOSEC) 20 MG capsule TAKE 1 CAPSULE BY MOUTH TWICE A DAY 12/24/21   Lorre Munroe, NP  traZODone (DESYREL) 50 MG tablet Take 0.5-1 tablets (25-50 mg total) by mouth at bedtime as  needed for sleep. 01/10/22   Lorre Munroe, NP    Family History Family History  Problem Relation Age of Onset   Hypertension Mother    Healthy Father    Down syndrome Sister    Hypertension Maternal Grandmother    Hypertension Maternal Grandfather    Hypertension Paternal Grandmother    Hypertension Paternal Grandfather     Social History Social History   Tobacco Use   Smoking status: Never   Smokeless tobacco: Never  Vaping Use   Vaping Use: Never used  Substance Use Topics   Alcohol use: Never   Drug use: Never     Allergies   Patient has no known allergies.   Review of Systems Review of Systems: negative unless otherwise stated in HPI.      Physical Exam Triage Vital Signs ED Triage Vitals  Enc Vitals Group     BP 01/20/22 1604 116/70     Pulse Rate 01/20/22 1604 79     Resp --      Temp 01/20/22 1604 99 F (37.2 C)     Temp Source 01/20/22 1604 Oral     SpO2 01/20/22 1604 97 %     Weight 01/20/22 1602 140 lb (63.5 kg)     Height 01/20/22 1602 4\' 10"  (1.473 m)     Head Circumference --  Peak Flow --      Pain Score 01/20/22 1602 0     Pain Loc --      Pain Edu? --      Excl. in GC? --    No data found.  Updated Vital Signs BP 116/70 (BP Location: Right Arm)   Pulse 79   Temp 99 F (37.2 C) (Oral)   Ht 4\' 10"  (1.473 m)   Wt 63.5 kg   LMP 01/06/2022 (Approximate)   SpO2 97%   BMI 29.26 kg/m   Visual Acuity Right Eye Distance:   Left Eye Distance:   Bilateral Distance:    Right Eye Near:   Left Eye Near:    Bilateral Near:     Physical Exam GEN:     alert, non-toxic appearing female in no distress     HENT:  mucus membranes moist, oropharyngeal  without lesions or  exudate, no  tonsillar hypertrophy,   mild oropharyngeal erythema ,   moderate erythematous hypertrophied turbinates,  clear nasal discharge, bilateral TM  normal EYES:   pupils equal and reactive, EOMi ,  no scleral injection NECK:  normal ROM RESP:  no increased  work of breathing, clear to auscultation bilaterally CVS:   regular rate and rhythm  Skin:   warm and dry, no rash on visible skin, prescribe refill    UC Treatments / Results  Labs (all labs ordered are listed, but only abnormal results are displayed) Labs Reviewed  RESP PANEL BY RT-PCR (FLU A&B, COVID) [id number]    EKG   Radiology No results found.  Procedures Procedures (including critical care time)  Medications Ordered in UC Medications - No data to display  Initial Impression / Assessment and Plan / UC Course  I have reviewed the triage vital signs and the nursing notes.  Pertinent labs & imaging results that were available during my care of the patient were reviewed by me and considered in my medical decision making (see chart for details).       Pt is a 21 y.o. female who presents for 3 days of respiratory symptoms. Bailley is afebrile here without recent antipyretics. Satting well on room air. Overall pt is well appearing, well hydrated, without respiratory distress.  Cardiopulmonary exam is unremarkable. COVID, influenza testing obtained. I will call patient if results are positive. History consistent with viral respiratory illness. Discussed symptomatic treatment. Pt to quarantine until COVID test results or longer if positive. Explained lack of efficacy of antibiotics in viral disease. - continue Tylenol/ Motrin as needed for discomfort/fever - nasal saline to help with his nasal congestion - Use a mist humidifier to help with breathing - Stressed importance of hydration - Zofran Rx sent to pharmacy for nausea  - Work note provided - Discussed return and ED precautions, understanding voiced.   Discussed MDM, treatment plan and plan for follow-up with patient/parent who agrees with plan.     Final Clinical Impressions(s) / UC Diagnoses   Final diagnoses:  Viral illness     Discharge Instructions      We will contact you if your COVID or flu  test are  positive.  Please quarantine while you wait for the results.  If your test is negative you may resume normal activities.  If your test is positive please continue to quarantine for at least 5 days from your symptom onset or until you are without a fever for at least 24 hours after the medications.   You can take Tylenol  and/or Ibuprofen as needed for fever reduction and pain relief.    For cough: honey 1/2 to 1 teaspoon (you can dilute the honey in water or another fluid).  You can also use guaifenesin and dextromethorphan for cough. You can use a humidifier for chest congestion and cough.  If you don't have a humidifier, you can sit in the bathroom with the hot shower running.      For sore throat: try warm salt water gargles, cepacol lozenges, throat spray, warm tea or water with lemon/honey, popsicles or ice, or OTC cold relief medicine for throat discomfort.    For congestion: take a daily anti-histamine like Zyrtec, Claritin, and a oral decongestant, such as pseudoephedrine.  You can also use Flonase 1-2 sprays in each nostril daily.    It is important to stay hydrated: drink plenty of fluids (water, gatorade/powerade/pedialyte, juices, or teas) to keep your throat moisturized and help further relieve irritation/discomfort.    Return or go to the Emergency Department if symptoms worsen or do not improve in the next few days      ED Prescriptions     Medication Sig Dispense Auth. Provider   ondansetron (ZOFRAN) 4 MG tablet Take 1 tablet (4 mg total) by mouth every 6 (six) hours. 12 tablet Lyndee Hensen, DO      PDMP not reviewed this encounter.   Lyndee Hensen, DO 01/20/22 1627

## 2022-01-20 NOTE — Discharge Instructions (Signed)
We will contact you if your COVID or flu  test are positive.  Please quarantine while you wait for the results.  If your test is negative you may resume normal activities.  If your test is positive please continue to quarantine for at least 5 days from your symptom onset or until you are without a fever for at least 24 hours after the medications.   You can take Tylenol and/or Ibuprofen as needed for fever reduction and pain relief.    For cough: honey 1/2 to 1 teaspoon (you can dilute the honey in water or another fluid).  You can also use guaifenesin and dextromethorphan for cough. You can use a humidifier for chest congestion and cough.  If you don't have a humidifier, you can sit in the bathroom with the hot shower running.      For sore throat: try warm salt water gargles, cepacol lozenges, throat spray, warm tea or water with lemon/honey, popsicles or ice, or OTC cold relief medicine for throat discomfort.    For congestion: take a daily anti-histamine like Zyrtec, Claritin, and a oral decongestant, such as pseudoephedrine.  You can also use Flonase 1-2 sprays in each nostril daily.    It is important to stay hydrated: drink plenty of fluids (water, gatorade/powerade/pedialyte, juices, or teas) to keep your throat moisturized and help further relieve irritation/discomfort.    Return or go to the Emergency Department if symptoms worsen or do not improve in the next few days

## 2022-02-09 ENCOUNTER — Ambulatory Visit
Admission: EM | Admit: 2022-02-09 | Discharge: 2022-02-09 | Disposition: A | Discharge status: Home / Self Care | Payer: No Typology Code available for payment source | Attending: Physician Assistant | Admitting: Physician Assistant

## 2022-02-09 ENCOUNTER — Ambulatory Visit (INDEPENDENT_AMBULATORY_CARE_PROVIDER_SITE_OTHER): Payer: No Typology Code available for payment source

## 2022-02-09 ENCOUNTER — Other Ambulatory Visit: Payer: Self-pay | Admitting: Internal Medicine

## 2022-02-09 ENCOUNTER — Ambulatory Visit: Payer: Self-pay | Admitting: *Deleted

## 2022-02-09 DIAGNOSIS — M25562 Pain in left knee: Secondary | ICD-10-CM

## 2022-02-09 MED ORDER — NAPROXEN 500 MG PO TABS: 500.0000 mg | ORAL_TABLET | Freq: Two times a day (BID) | ORAL | 1 refills | Status: AC

## 2022-02-09 NOTE — ED Provider Notes (Signed)
[facility]    CSN: [id number] Arrival date & time: 02/09/22  1531      History   Chief Complaint Chief Complaint  Patient presents with   Leg Pain    Entered by patient    HPI Michele Dunlap is a 21 y.o. female presenting for approximately 3-week history of left anterior knee pain and swelling with radiation to the left shin.  She denies any pain in her thigh or back.  No foot pain.  Patient says it feels like a burning pain.  She says pain is worse when she tries to walk and bear weight.  Pain gets a little better when she is resting and not using her extremity.  She reports increased pain with climbing stairs and performing the exercises at the gym.  Patient does report that she works out and does use the stair climber as well as weight machines.  She also walks.  She denies any change in her activity level and states that it is not new that she has been attending the gym.  She denies any new exercises.  She has not had any trauma or injuries at all to the knee.  No diabetes, weight issue daily.  Patient denies any problems with his knee in the past.  No history of surgery or major injury.  Has been taking Tylenol as needed for pain relief.  Patient reports she has to stand for long periods of time at [employer] while working and this does increase the pain as well.  No other complaints.  HPI  Past Medical History:  Diagnosis Date   Allergy    Migraine     Patient Active Problem List   Diagnosis Date Noted   Insomnia 01/10/2022   Overweight with body mass index (BMI) of 29 to 29.9 in adult 11/25/2021   Anxiety and depression [id number]   Eosinophilic esophagitis [id number]   Migraine 02/27/2012    History reviewed. No pertinent surgical history.  OB History   No obstetric history on file.      Home Medications    Prior to Admission medications   Medication Sig Start Date End Date Taking? Authorizing Provider  naproxen (NAPROSYN) 500 MG tablet Take 1 tablet  (500 mg total) by mouth 2 (two) times daily for 15 days. 02/09/22 02/24/22 Yes Danton Clap, PA-C  FLUoxetine (PROZAC) 20 MG capsule Take 1 capsule (20 mg total) by mouth daily. 01/10/22   Jearld Fenton, NP  Norgestimate-Ethinyl Estradiol Triphasic (TRI-SPRINTEC) 0.18/0.215/0.25 MG-35 MCG tablet Take 1 tablet by mouth daily. 11/25/21   Jearld Fenton, NP  omeprazole (PRILOSEC) 20 MG capsule TAKE 1 CAPSULE BY MOUTH TWICE A DAY 12/24/21   Jearld Fenton, NP  ondansetron (ZOFRAN) 4 MG tablet Take 1 tablet (4 mg total) by mouth every 6 (six) hours. 01/20/22   Lyndee Hensen, DO  traZODone (DESYREL) 50 MG tablet Take 0.5-1 tablets (25-50 mg total) by mouth at bedtime as needed for sleep. 01/10/22   Jearld Fenton, NP    Family History Family History  Problem Relation Age of Onset   Hypertension Mother    Healthy Father    Down syndrome Sister    Hypertension Maternal Grandmother    Hypertension Maternal Grandfather    Hypertension Paternal Grandmother    Hypertension Paternal Grandfather     Social History Social History   Tobacco Use   Smoking status: Never   Smokeless tobacco: Never  Vaping Use   Vaping Use: Never  used  Substance Use Topics   Alcohol use: Never   Drug use: Never     Allergies   Patient has no known allergies.   Review of Systems Review of Systems  Musculoskeletal:  Positive for arthralgias, gait problem and joint swelling. Negative for back pain and myalgias.  Skin:  Negative for color change, rash and wound.  Neurological:  Negative for weakness and numbness.     Physical Exam Triage Vital Signs ED Triage Vitals [02/09/22 1553]  Enc Vitals Group     BP      Pulse      Resp      Temp      Temp src      SpO2      Weight 141 lb 6.4 oz (64.1 kg)     Height      Head Circumference      Peak Flow      Pain Score 6     Pain Loc      Pain Edu?      Excl. in [place]?    No data found.  Updated Vital Signs BP 124/83 (BP Location: Left Arm)   Pulse  79   Temp 98.4 F (36.9 C) (Oral)   Resp 18   Wt 141 lb 6.4 oz (64.1 kg)   LMP 01/26/2022 (Approximate)   SpO2 100%   BMI 29.55 kg/m   Physical Exam Vitals and nursing note reviewed.  Constitutional:      General: She is not in acute distress.    Appearance: Normal appearance. She is not ill-appearing or toxic-appearing.  HENT:     Head: Normocephalic and atraumatic.  Eyes:     General: No scleral icterus.       Right eye: No discharge.        Left eye: No discharge.     Conjunctiva/sclera: Conjunctivae normal.  Cardiovascular:     Rate and Rhythm: Normal rate.  Pulmonary:     Effort: Pulmonary effort is normal. No respiratory distress.  Musculoskeletal:     Cervical back: Neck supple.     Left knee: Swelling (mild) and bony tenderness (patella) present. No deformity. Normal range of motion (pain with full flexion). Tenderness present over the medial joint line, lateral joint line and patellar tendon. Normal pulse.  Skin:    General: Skin is dry.  Neurological:     General: No focal deficit present.     Mental Status: She is alert. Mental status is at baseline.     Motor: No weakness.     Gait: Gait normal.  Psychiatric:        Mood and Affect: Mood normal.        Behavior: Behavior normal.        Thought Content: Thought content normal.      UC Treatments / Results  Labs (all labs ordered are listed, but only abnormal results are displayed) Labs Reviewed - No data to display  EKG   Radiology DG Knee Complete 4 Views Left  Result Date: 02/09/2022 CLINICAL DATA:  Atraumatic left knee pain and swelling for 2-3 weeks. EXAM: LEFT KNEE - COMPLETE 4+ VIEW COMPARISON:  None Available. FINDINGS: No evidence of fracture, dislocation, or joint effusion. Normal alignment. Normal joint spaces. No evidence of arthropathy or other focal bone abnormality. Soft tissues are unremarkable. IMPRESSION: Negative radiographs of the left knee. Electronically Signed   By: Keith Rake M.D.   On: 02/09/2022 16:24    Procedures  Procedures (including critical care time)  Medications Ordered in UC Medications - No data to display  Initial Impression / Assessment and Plan / UC Course  I have reviewed the triage vital signs and the nursing notes.  Pertinent labs & imaging results that were available during my care of the patient were reviewed by me and considered in my medical decision making (see chart for details).   21 year old female presenting for atraumatic left knee pain x2 to 3 weeks.  It is associate with swelling and the pain radiates to the shin.  No injury or major problems with this knee in the past.  No history of surgery.  Vitals normal and stable patient is overall well-appearing.  She has mild swelling of the anterior knee.  No deformity.  She has full range of motion of the knee but seems to have some discomfort with full flexion.  Good stability and strength.  Tenderness palpation of the patella, patellar tendon and medial and lateral joint line.  No tenderness of the posterior knee.  No tenderness of the shin or calf.  X-ray obtained today shows no acute abnormality which I discussed with the patient.  Suspect overuse or strain, possibly related to activity she is doing at the gym.  Advised not to do the stair climber anymore and hold off on knee exercises until this gets better.  We will start patient on anti-inflammatory medication.  Sent naproxen to pharmacy.  Advise she can continue the Tylenol.  She was also provided with a knee brace.  If no improvement in next couple weeks or symptoms worsen she is to follow-up with orthopedics.  Patient given information of orthopedics.  Follow-up here as needed.   Final Clinical Impressions(s) / UC Diagnoses   Final diagnoses:  Acute pain of left knee     Discharge Instructions      SPRAIN: Stressed avoiding painful activities . Reviewed RICE guidelines. Use medications as directed, including NSAIDs.  If no NSAIDs have been prescribed for you today, you may take Aleve or Motrin over the counter. May use Tylenol in between doses of NSAIDs.  If no improvement in the next 1-2 weeks, f/u with PCP or return to our office for reexamination, and please feel free to call or return at any time for any questions or concerns you may have and we will be happy to help you!      You have a condition requiring you to follow up with Orthopedics so please call one of the following office for appointment:   [facility] [address] [place], [place] [address] Phone: [phone]  [facility] [address], [place], [place] [address] Phone: [phone]      ED Prescriptions     Medication Sig Dispense Auth. Provider   naproxen (NAPROSYN) 500 MG tablet Take 1 tablet (500 mg total) by mouth 2 (two) times daily for 15 days. 30 tablet Gretta Cool      PDMP not reviewed this encounter.   Danton Clap, PA-C 02/09/22 1651

## 2022-02-09 NOTE — Telephone Encounter (Signed)
  Chief Complaint: Leg pain Symptoms: 8-9/10 left leg pain, "Entire leg, can barely walk." States "Just sore." at rest. Mild swelling at knee. Pain is worse from ankle through calf. Frequency: 2 weeks, worsening Pertinent Negatives: Patient denies injury. Disposition: [] ED /[x] Urgent Care (no appt availability in office) / [] Appointment(In office/virtual)/ []  [facility]/ [] Home Care/ [] Refused Recommended Disposition /[] [facility]/ []  Follow-up with PCP Additional Notes: Advised UC, states will follow disposition. Reason for Disposition  [1] SEVERE pain (e.g., excruciating, unable to do any normal activities) AND [2] not improved after 2 hours of pain medicine  Answer Assessment - Initial Assessment Questions 1. ONSET: "When did the pain start?"      2 weeks, worsening 2. LOCATION: "Where is the pain located?"      Entire left leg 3. PAIN: "How bad is the pain?"    (Scale 1-10; or mild, moderate, severe)   -  MILD (1-3): doesn't interfere with normal activities    -  MODERATE (4-7): interferes with normal activities (e.g., work or school) or awakens from sleep, limping    -  SEVERE (8-10): excruciating pain, unable to do any normal activities, unable to walk     At rest "Sore" with walking 8/10 4. WORK OR EXERCISE: "Has there been any recent work or exercise that involved this part of the body?"      No 5. CAUSE: "What do you think is causing the leg pain?"     Unsure 6. OTHER SYMPTOMS: "Do you have any other symptoms?" (e.g., chest pain, back pain, breathing difficulty, swelling, rash, fever, numbness, weakness)     After walking leg feels warm.Mild swelling at knee  Protocols used: Leg Pain-A-AH

## 2022-02-09 NOTE — ED Triage Notes (Signed)
Pt present left leg pain while walking. Symptom started three weeks ago. Pain starts from knee and radiant down her leg.

## 2022-02-09 NOTE — Telephone Encounter (Signed)
Requested medication (s) are due for refill today: yes  Requested medication (s) are on the active medication list:yes  Last refill:  01/10/22  Future visit scheduled: yes  Notes to clinic:  Unable to refill per protocol, request for 90 day supply, routing for review.     Requested Prescriptions  Pending Prescriptions Disp Refills   traZODone (DESYREL) 50 MG tablet [Pharmacy Med Name: TRAZODONE 50 MG TABLET] 84 tablet 1    Sig: TAKE 0.5-1 TABLETS BY MOUTH AT BEDTIME AS NEEDED FOR SLEEP.     Psychiatry: Antidepressants - Serotonin Modulator Passed - 02/09/2022  1:38 PM      Passed - Completed PHQ-2 or PHQ-9 in the last 360 days      Passed - Valid encounter within last 6 months    Recent Outpatient Visits           1 month ago Anxiety and depression   [facility] [place], [place] W, NP   2 months ago Overweight with body mass index (BMI) of 29 to 29.9 in adult   [facility], Coralie Keens, NP   2 months ago Encounter for general adult medical examination with abnormal findings   [facility] [place], Coralie Keens, NP   9 years ago Pain of sternum   [facility], Gay Filler, MD   10 years ago Impetigo   [facility], Janalee Dane, MD       Future Appointments             In 3 months Baity, Coralie Keens, NP [facility], [facility]

## 2022-02-09 NOTE — Discharge Instructions (Addendum)
SPRAIN: Stressed avoiding painful activities . Reviewed RICE guidelines. Use medications as directed, including NSAIDs. If no NSAIDs have been prescribed for you today, you may take Aleve or Motrin over the counter. May use Tylenol in between doses of NSAIDs.  If no improvement in the next 1-2 weeks, f/u with PCP or return to our office for reexamination, and please feel free to call or return at any time for any questions or concerns you may have and we will be happy to help you!      You have a condition requiring you to follow up with Orthopedics so please call one of the following office for appointment:   [facility] [address], [place], [place] [address] Phone: [phone]  [facility] [address], [place], [place] [address] Phone: [phone]  

## 2022-02-22 ENCOUNTER — Other Ambulatory Visit: Payer: Self-pay | Admitting: Physician Assistant

## 2022-02-22 DIAGNOSIS — R519 Headache, unspecified: Secondary | ICD-10-CM

## 2022-02-22 DIAGNOSIS — M62838 Other muscle spasm: Secondary | ICD-10-CM

## 2022-02-22 DIAGNOSIS — R202 Paresthesia of skin: Secondary | ICD-10-CM

## 2022-02-22 DIAGNOSIS — G35 Multiple sclerosis: Secondary | ICD-10-CM

## 2022-02-22 DIAGNOSIS — G35D Multiple sclerosis, unspecified: Secondary | ICD-10-CM

## 2022-02-22 DIAGNOSIS — R4789 Other speech disturbances: Secondary | ICD-10-CM

## 2022-02-22 DIAGNOSIS — R42 Dizziness and giddiness: Secondary | ICD-10-CM

## 2022-03-10 ENCOUNTER — Ambulatory Visit
Admission: RE | Admit: 2022-03-10 | Discharge: 2022-03-10 | Disposition: A | Discharge status: Home / Self Care | Payer: No Typology Code available for payment source | Source: Ambulatory Visit | Attending: Physician Assistant | Admitting: Physician Assistant

## 2022-03-10 DIAGNOSIS — R4789 Other speech disturbances: Secondary | ICD-10-CM

## 2022-03-10 DIAGNOSIS — R202 Paresthesia of skin: Secondary | ICD-10-CM

## 2022-03-10 DIAGNOSIS — R519 Headache, unspecified: Secondary | ICD-10-CM

## 2022-03-10 DIAGNOSIS — R42 Dizziness and giddiness: Secondary | ICD-10-CM

## 2022-03-10 DIAGNOSIS — M62838 Other muscle spasm: Secondary | ICD-10-CM

## 2022-03-10 DIAGNOSIS — G35 Multiple sclerosis: Secondary | ICD-10-CM

## 2022-03-10 MED ORDER — GADOBENATE DIMEGLUMINE 529 MG/ML IV SOLN
15.0000 mL | Freq: Once | INTRAVENOUS | Status: AC | PRN
  Administered 2022-03-10: 15 mL via INTRAVENOUS

## 2022-03-14 ENCOUNTER — Encounter: Payer: Self-pay | Admitting: Physician Assistant

## 2022-03-14 ENCOUNTER — Other Ambulatory Visit: Payer: Self-pay | Admitting: Physician Assistant

## 2022-03-14 DIAGNOSIS — G35 Multiple sclerosis: Secondary | ICD-10-CM

## 2022-03-29 ENCOUNTER — Other Ambulatory Visit: Payer: Self-pay | Admitting: Internal Medicine

## 2022-03-29 NOTE — Telephone Encounter (Signed)
Requested Prescriptions  Pending Prescriptions Disp Refills   omeprazole (PRILOSEC) 20 MG capsule [Pharmacy Med Name: OMEPRAZOLE DR 20 MG CAPSULE] 180 capsule 0    Sig: TAKE 1 CAPSULE BY MOUTH TWICE A DAY     Gastroenterology: Proton Pump Inhibitors Passed - 03/29/2022  1:05 AM      Passed - Valid encounter within last 12 months    Recent Outpatient Visits           2 months ago Anxiety and depression   [facility] [place], [place] W, NP   3 months ago Overweight with body mass index (BMI) of 29 to 29.9 in adult   [facility] [place], Salvadore Oxford, NP   4 months ago Encounter for general adult medical examination with abnormal findings   [facility] [place], Salvadore Oxford, NP   10 years ago Pain of sternum   [facility], Gwenlyn Found, MD   10 years ago Impetigo   [facility], Tessa Lerner, MD       Future Appointments             In 2 months Baity, Salvadore Oxford, NP [facility], [facility]

## 2022-03-30 NOTE — Progress Notes (Signed)
Patient for IR Lumbar Puncture on Thurs 03/31/2022, I called and spoke with the patient on the  phone and gave pre-procedure instructions. Pt was made aware to be here at 8:30a at the new entrance, Pt is taking Valium from her PCP, okd by Candise Bowens the IR PA due to pt has TICKS and nervous about that.  Pt stated understanding. Called 03/25/2022 and 03/30/2022

## 2022-03-31 ENCOUNTER — Ambulatory Visit
Admission: RE | Admit: 2022-03-31 | Discharge: 2022-03-31 | Disposition: A | Discharge status: Home / Self Care | Payer: No Typology Code available for payment source | Source: Ambulatory Visit | Attending: Physician Assistant | Admitting: Physician Assistant

## 2022-03-31 ENCOUNTER — Encounter: Payer: Self-pay | Admitting: Radiology

## 2022-03-31 VITALS — BP 113/56 | HR 76 | Resp 14 | Ht <= 58 in | Wt 135.0 lb

## 2022-03-31 DIAGNOSIS — G35 Multiple sclerosis: Secondary | ICD-10-CM | POA: Diagnosis present

## 2022-03-31 DIAGNOSIS — G43C1 Periodic headache syndromes in child or adult, intractable: Secondary | ICD-10-CM

## 2022-03-31 HISTORY — PX: IR FL GUIDED LOC OF NEEDLE/CATH TIP FOR SPINAL INJECTION LT: IMG2396

## 2022-03-31 LAB — PROTEIN, CSF: Total  Protein, CSF: 28 mg/dL (ref 15–45)

## 2022-03-31 LAB — PREGNANCY, URINE: Preg Test, Ur: NEGATIVE

## 2022-03-31 LAB — PATHOLOGIST SMEAR REVIEW

## 2022-03-31 MED ORDER — LIDOCAINE HCL (PF) 1 % IJ SOLN
INTRAMUSCULAR | Status: AC
  Filled 2022-03-31: qty 30

## 2022-04-01 LAB — CSF CELL COUNT WITH DIFFERENTIAL
Eosinophils, CSF: 4 %
Lymphs, CSF: 47 %
Monocyte-Macrophage-Spinal Fluid: 10 %
RBC Count, CSF: 1287 /mm3 — ABNORMAL HIGH (ref 0–3)
Segmented Neutrophils-CSF: 39 %
Tube #: 3
WBC, CSF: 15 /mm3 (ref 0–5)

## 2022-04-01 LAB — VDRL, CSF: VDRL Quant, CSF: NONREACTIVE

## 2022-04-03 LAB — CSF CULTURE W GRAM STAIN
Culture: NO GROWTH
Gram Stain: NONE SEEN

## 2022-04-05 LAB — CSF IGG: IgG, CSF: 1.9 mg/dL (ref 0.0–6.7)

## 2022-04-05 LAB — OLIGOCLONAL BANDS, CSF + SERM

## 2022-04-21 ENCOUNTER — Ambulatory Visit (INDEPENDENT_AMBULATORY_CARE_PROVIDER_SITE_OTHER): Payer: No Typology Code available for payment source | Admitting: Gastroenterology

## 2022-04-21 ENCOUNTER — Encounter: Payer: Self-pay | Admitting: Gastroenterology

## 2022-04-21 VITALS — BP 134/90 | HR 80 | Temp 98.4°F | Ht 59.0 in | Wt 146.0 lb

## 2022-04-21 DIAGNOSIS — R1319 Other dysphagia: Secondary | ICD-10-CM

## 2022-04-21 DIAGNOSIS — K200 Eosinophilic esophagitis: Secondary | ICD-10-CM | POA: Diagnosis not present

## 2022-04-21 NOTE — H&P (View-Only) (Signed)
Gastroenterology Consultation  Referring Provider:     Lorre Munroe, NP Primary Care Physician:  Lorre Munroe, NP Primary Gastroenterologist:  Dr. Servando Snare     Reason for Consultation:     Eosinophilic esophagitis        HPI:   Michele Dunlap is a 21 y.o. y/o female referred for consultation & management of Eosinophilic esophagitis by Dr. Sampson Si, Salvadore Oxford, NP.  This patient comes in today after being seen in the past by pediatric GI with a upper endoscopy for which the patient states that she was told there was no treatment for Her eosinophilic esophagitis.  The patient was started on omeprazole 20 mg.  The patient states that she was told that if her symptoms did not improve she would need another upper endoscopy and possibly go on a food elimination diet.  The patient's biopsies showed:  A: Esophagus, distal, biopsy - Squamous mucosa with increased intraepithelial eosinophils (up to 17 in a 40x field) and minimal acute inflammation (see comment) - No eosinophilic microabscesses or lamina propria fibrosis identified - No intestinal metaplasia or dysplasia identified     B: Esophagus, proximal, biopsy - Squamous mucosa with increased intraepithelial eosinophils (up to 14 in a 40x field) (see comment) - No eosinophilic microabscesses or lamina propria fibrosis identified - No intestinal metaplasia or dysplasia identified     C: Stomach, biopsy - Histologically-unremarkable oxyntic and antral-type mucosa - No Helicobacter organisms identified by H&E stain    D: Small bowel, duodenum, biopsy - Histologically-unremarkable duodenal mucosa - No increase in villus intraepithelial lymphocytes identified   The endoscopy report also suggested for findings consistent with eosinophilic esophagitis.  The patient reports that she continues to have dysphagia to solids and has to chew food up vigorously.   Past Medical History:  Diagnosis Date   Allergy    Migraine     Past Surgical  History:  Procedure Laterality Date   IR FL GUIDED LOC OF NEEDLE/CATH TIP FOR SPINAL INJECTION LT  03/31/2022    Prior to Admission medications   Medication Sig Start Date End Date Taking? Authorizing Provider  FLUoxetine (PROZAC) 20 MG capsule Take 1 capsule (20 mg total) by mouth daily. 01/10/22   Lorre Munroe, NP  gabapentin (NEURONTIN) 300 MG capsule Take 300 mg by mouth 2 (two) times daily.    [provider]  Norgestimate-Ethinyl Estradiol Triphasic (TRI-SPRINTEC) 0.18/0.215/0.25 MG-35 MCG tablet Take 1 tablet by mouth daily. 11/25/21   Lorre Munroe, NP  omeprazole (PRILOSEC) 20 MG capsule TAKE 1 CAPSULE BY MOUTH TWICE A DAY 03/29/22   Lorre Munroe, NP  ondansetron (ZOFRAN) 4 MG tablet Take 1 tablet (4 mg total) by mouth every 6 (six) hours. 01/20/22   Katha Cabal, DO  traZODone (DESYREL) 50 MG tablet TAKE 0.5-1 TABLETS BY MOUTH AT BEDTIME AS NEEDED FOR SLEEP. 02/10/22   Lorre Munroe, NP    Family History  Problem Relation Age of Onset   Hypertension Mother    Healthy Father    Down syndrome Sister    Hypertension Maternal Grandmother    Hypertension Maternal Grandfather    Hypertension Paternal Grandmother    Hypertension Paternal Grandfather      Social History   Tobacco Use   Smoking status: Never   Smokeless tobacco: Never  Vaping Use   Vaping Use: Never used  Substance Use Topics   Alcohol use: Never   Drug use: Never    Allergies as  of 04/21/2022 - Review Complete 03/31/2022  Allergen Reaction Noted   Multihance [gadobenate] Nausea And Vomiting 03/10/2022    Review of Systems:    All systems reviewed and negative except where noted in HPI.   Physical Exam:  LMP 03/23/2022 (Approximate) Comment: negative pregnancy test 03/31/22 Patient's last menstrual period was 03/23/2022 (approximate). General:   Alert,  Well-developed, well-nourished, pleasant and cooperative in NAD Head:  Normocephalic and atraumatic. Eyes:  Sclera clear, no  icterus.   Conjunctiva pink. Ears:  Normal auditory acuity. Neck:  Supple; no masses or thyromegaly. Lungs:  Respirations even and unlabored.  Clear throughout to auscultation.   No wheezes, crackles, or rhonchi. No acute distress. Heart:  Regular rate and rhythm; no murmurs, clicks, rubs, or gallops. Abdomen:  Normal bowel sounds.  No bruits.  Soft, non-tender and non-distended without masses, hepatosplenomegaly or hernias noted.  No guarding or rebound tenderness.  Negative Carnett sign.   Rectal:  Deferred.  Pulses:  Normal pulses noted. Extremities:  No clubbing or edema.  No cyanosis. Neurologic:  Alert and oriented x3;  grossly normal neurologically. Skin:  Intact without significant lesions or rashes.  No jaundice. Lymph Nodes:  No significant cervical adenopathy. Psych:  Alert and cooperative. Normal mood and affect.  Imaging Studies: IR FL GUIDED LOC OF NEEDL/CATH TIP FOR SPINAL INJECT LT  Addendum Date: 03/31/2022   ADDENDUM REPORT: 03/31/2022 17:23 ADDENDUM: The lab called with critical findings on tube #3 with elevated wbc of 15, the patient had already been discharged post procedure and was doing well clinically and ready for discharge per nursing report. Pattricia Boss, PA personally called the ordering provider Nilda Calamity PA-C and left a message with her neurology office call center for callback in regard to the critical finding, the lab technician was also contacting the ordering provider Nilda Calamity PA-C regarding the lab finding. Pattricia Boss personally called the patient, however phone call went to voicemail and I left a message for patient to return my call. Electronically Signed   By: Donzetta Kohut M.D.   On: 03/31/2022 17:23   Result Date: 03/31/2022 INDICATION: Patient with complaints of headaches, numbness and tingling in extremities and face and request for diagnostic lumbar puncture from neurology to evaluate for multiple sclerosis. EXAM: DIAGNOSTIC LUMBAR PUNCTURE  UNDER FLUOROSCOPIC GUIDANCE COMPARISON:  MR head performed March 10, 2022 reviewed prior to the procedure. FLUOROSCOPY TIME:  Radiation Exposure Index (if provided by the fluoroscopic device): 0.70 mGy PROCEDURE: Informed consent was obtained from the patient/patient's legal guardian prior to the procedure, including potential complications of bleeding, infection, paresthesias, nerve damage, CSF leak requiring additional procedures, post procedure requirement to lay flat for several hours after the procedure, headache, allergy, and pain. With the patient prone, the lower back was prepped with Betadine. 1% Lidocaine was used for local anesthesia. Lumbar puncture was performed at the L4-L5 level using a 20 gauge needle with return of colorless CSF with an opening pressure of 19 cm water. 10 ml of CSF were obtained for laboratory studies. The third tube had a small tinge hue of blood given the need for slight needle repositioning after decrease in fluid. A closing pressure of 13 cm of water. The inner stylet was placed back into the needle and the needle was removed in its entirety. The patient tolerated the procedure well and there were no apparent complications. A sterile bandage was applied. COMPLICATIONS: None immediate. IMPRESSION: Technically successful fluoroscopic guided lumbar puncture. This exam was performed by Pattricia Boss  PA-C, and was supervised and interpreted by Dr. Nadene Rubins. Electronically Signed: By: Donzetta Kohut M.D. On: 03/31/2022 12:07    Assessment and Plan:   ORPHA DAIN is a 21 y.o. y/o female Who comes in today with a history of eosinophilic esophagitis.  The patient has not been helped with her low-dose PPI.  The patient has now been sent to me since she is no longer followed by pediatric gastroenterology.  The patient will be set up for an upper endoscopy with possible dilation.  The patient has been told that there are a number of treatment options including fluticasone,  budesonide, high-dose PPI and to Dupexent.  The patient will follow up at the time of the upper endoscopy.  The patient has been explained the plan and agrees with it   Midge Minium, MD. Clementeen Graham    Note: This dictation was prepared with Dragon dictation along with smaller phrase technology. Any transcriptional errors that result from this process are unintentional.

## 2022-04-21 NOTE — Progress Notes (Signed)
Gastroenterology Consultation  Referring Provider:     Lorre Munroe, NP Primary Care Physician:  Lorre Munroe, NP Primary Gastroenterologist:  Dr. Servando Snare     Reason for Consultation:     Eosinophilic esophagitis        HPI:   Michele Dunlap is a 21 y.o. y/o female referred for consultation & management of Eosinophilic esophagitis by Dr. Sampson Si, Salvadore Oxford, NP.  This patient comes in today after being seen in the past by pediatric GI with a upper endoscopy for which the patient states that she was told there was no treatment for Her eosinophilic esophagitis.  The patient was started on omeprazole 20 mg.  The patient states that she was told that if her symptoms did not improve she would need another upper endoscopy and possibly go on a food elimination diet.  The patient's biopsies showed:  A: Esophagus, distal, biopsy - Squamous mucosa with increased intraepithelial eosinophils (up to 17 in a 40x field) and minimal acute inflammation (see comment) - No eosinophilic microabscesses or lamina propria fibrosis identified - No intestinal metaplasia or dysplasia identified     B: Esophagus, proximal, biopsy - Squamous mucosa with increased intraepithelial eosinophils (up to 14 in a 40x field) (see comment) - No eosinophilic microabscesses or lamina propria fibrosis identified - No intestinal metaplasia or dysplasia identified     C: Stomach, biopsy - Histologically-unremarkable oxyntic and antral-type mucosa - No Helicobacter organisms identified by H&E stain    D: Small bowel, duodenum, biopsy - Histologically-unremarkable duodenal mucosa - No increase in villus intraepithelial lymphocytes identified   The endoscopy report also suggested for findings consistent with eosinophilic esophagitis.  The patient reports that she continues to have dysphagia to solids and has to chew food up vigorously.   Past Medical History:  Diagnosis Date   Allergy    Migraine     Past Surgical  History:  Procedure Laterality Date   IR FL GUIDED LOC OF NEEDLE/CATH TIP FOR SPINAL INJECTION LT  03/31/2022    Prior to Admission medications   Medication Sig Start Date End Date Taking? Authorizing Provider  FLUoxetine (PROZAC) 20 MG capsule Take 1 capsule (20 mg total) by mouth daily. 01/10/22   Lorre Munroe, NP  gabapentin (NEURONTIN) 300 MG capsule Take 300 mg by mouth 2 (two) times daily.    [provider]  Norgestimate-Ethinyl Estradiol Triphasic (TRI-SPRINTEC) 0.18/0.215/0.25 MG-35 MCG tablet Take 1 tablet by mouth daily. 11/25/21   Lorre Munroe, NP  omeprazole (PRILOSEC) 20 MG capsule TAKE 1 CAPSULE BY MOUTH TWICE A DAY 03/29/22   Lorre Munroe, NP  ondansetron (ZOFRAN) 4 MG tablet Take 1 tablet (4 mg total) by mouth every 6 (six) hours. 01/20/22   Katha Cabal, DO  traZODone (DESYREL) 50 MG tablet TAKE 0.5-1 TABLETS BY MOUTH AT BEDTIME AS NEEDED FOR SLEEP. 02/10/22   Lorre Munroe, NP    Family History  Problem Relation Age of Onset   Hypertension Mother    Healthy Father    Down syndrome Sister    Hypertension Maternal Grandmother    Hypertension Maternal Grandfather    Hypertension Paternal Grandmother    Hypertension Paternal Grandfather      Social History   Tobacco Use   Smoking status: Never   Smokeless tobacco: Never  Vaping Use   Vaping Use: Never used  Substance Use Topics   Alcohol use: Never   Drug use: Never    Allergies as  of 04/21/2022 - Review Complete 03/31/2022  Allergen Reaction Noted   Multihance [gadobenate] Nausea And Vomiting 03/10/2022    Review of Systems:    All systems reviewed and negative except where noted in HPI.   Physical Exam:  LMP 03/23/2022 (Approximate) Comment: negative pregnancy test 03/31/22 Patient's last menstrual period was 03/23/2022 (approximate). General:   Alert,  Well-developed, well-nourished, pleasant and cooperative in NAD Head:  Normocephalic and atraumatic. Eyes:  Sclera clear, no  icterus.   Conjunctiva pink. Ears:  Normal auditory acuity. Neck:  Supple; no masses or thyromegaly. Lungs:  Respirations even and unlabored.  Clear throughout to auscultation.   No wheezes, crackles, or rhonchi. No acute distress. Heart:  Regular rate and rhythm; no murmurs, clicks, rubs, or gallops. Abdomen:  Normal bowel sounds.  No bruits.  Soft, non-tender and non-distended without masses, hepatosplenomegaly or hernias noted.  No guarding or rebound tenderness.  Negative Carnett sign.   Rectal:  Deferred.  Pulses:  Normal pulses noted. Extremities:  No clubbing or edema.  No cyanosis. Neurologic:  Alert and oriented x3;  grossly normal neurologically. Skin:  Intact without significant lesions or rashes.  No jaundice. Lymph Nodes:  No significant cervical adenopathy. Psych:  Alert and cooperative. Normal mood and affect.  Imaging Studies: IR FL GUIDED LOC OF NEEDL/CATH TIP FOR SPINAL INJECT LT  Addendum Date: 03/31/2022   ADDENDUM REPORT: 03/31/2022 17:23 ADDENDUM: The lab called with critical findings on tube #3 with elevated wbc of 15, the patient had already been discharged post procedure and was doing well clinically and ready for discharge per nursing report. Koreen Morgan, PA personally called the ordering provider Sarah Mason PA-C and left a message with her neurology office call center for callback in regard to the critical finding, the lab technician was also contacting the ordering provider Sarah Mason PA-C regarding the lab finding. Koreen Morgan personally called the patient, however phone call went to voicemail and I left a message for patient to return my call. Electronically Signed   By: Geoffrey  Wile M.D.   On: 03/31/2022 17:23   Result Date: 03/31/2022 INDICATION: Patient with complaints of headaches, numbness and tingling in extremities and face and request for diagnostic lumbar puncture from neurology to evaluate for multiple sclerosis. EXAM: DIAGNOSTIC LUMBAR PUNCTURE  UNDER FLUOROSCOPIC GUIDANCE COMPARISON:  MR head performed March 10, 2022 reviewed prior to the procedure. FLUOROSCOPY TIME:  Radiation Exposure Index (if provided by the fluoroscopic device): 0.70 mGy PROCEDURE: Informed consent was obtained from the patient/patient's legal guardian prior to the procedure, including potential complications of bleeding, infection, paresthesias, nerve damage, CSF leak requiring additional procedures, post procedure requirement to lay flat for several hours after the procedure, headache, allergy, and pain. With the patient prone, the lower back was prepped with Betadine. 1% Lidocaine was used for local anesthesia. Lumbar puncture was performed at the L4-L5 level using a 20 gauge needle with return of colorless CSF with an opening pressure of 19 cm water. 10 ml of CSF were obtained for laboratory studies. The third tube had a small tinge hue of blood given the need for slight needle repositioning after decrease in fluid. A closing pressure of 13 cm of water. The inner stylet was placed back into the needle and the needle was removed in its entirety. The patient tolerated the procedure well and there were no apparent complications. A sterile bandage was applied. COMPLICATIONS: None immediate. IMPRESSION: Technically successful fluoroscopic guided lumbar puncture. This exam was performed by Koreen Morgan   PA-C, and was supervised and interpreted by Dr. Wile. Electronically Signed: By: Geoffrey  Wile M.D. On: 03/31/2022 12:07    Assessment and Plan:   Amaranta S Olazabal is a 21 y.o. y/o female Who comes in today with a history of eosinophilic esophagitis.  The patient has not been helped with her low-dose PPI.  The patient has now been sent to me since she is no longer followed by pediatric gastroenterology.  The patient will be set up for an upper endoscopy with possible dilation.  The patient has been told that there are a number of treatment options including fluticasone,  budesonide, high-dose PPI and to Dupexent.  The patient will follow up at the time of the upper endoscopy.  The patient has been explained the plan and agrees with it   Jaccob Czaplicki, MD. FACG    Note: This dictation was prepared with Dragon dictation along with smaller phrase technology. Any transcriptional errors that result from this process are unintentional.   

## 2022-04-21 NOTE — Addendum Note (Signed)
Addended by: Roena Malady on: 04/21/2022 05:32 PM   Modules accepted: Orders

## 2022-04-26 ENCOUNTER — Encounter: Payer: Self-pay | Admitting: Gastroenterology

## 2022-04-29 ENCOUNTER — Ambulatory Visit
Admission: RE | Admit: 2022-04-29 | Discharge: 2022-04-29 | Disposition: A | Discharge status: Home / Self Care | Payer: No Typology Code available for payment source | Source: Ambulatory Visit | Attending: Gastroenterology | Admitting: Gastroenterology

## 2022-04-29 ENCOUNTER — Ambulatory Visit: Payer: No Typology Code available for payment source | Admitting: General Practice

## 2022-04-29 ENCOUNTER — Encounter
Admission: RE | Disposition: A | Discharge status: Home / Self Care | Payer: Self-pay | Source: Ambulatory Visit | Attending: Gastroenterology

## 2022-04-29 ENCOUNTER — Other Ambulatory Visit: Payer: Self-pay

## 2022-04-29 DIAGNOSIS — F32A Depression, unspecified: Secondary | ICD-10-CM | POA: Insufficient documentation

## 2022-04-29 DIAGNOSIS — F419 Anxiety disorder, unspecified: Secondary | ICD-10-CM | POA: Insufficient documentation

## 2022-04-29 DIAGNOSIS — K222 Esophageal obstruction: Secondary | ICD-10-CM | POA: Diagnosis not present

## 2022-04-29 DIAGNOSIS — R1319 Other dysphagia: Secondary | ICD-10-CM | POA: Diagnosis not present

## 2022-04-29 DIAGNOSIS — R1310 Dysphagia, unspecified: Secondary | ICD-10-CM | POA: Diagnosis present

## 2022-04-29 DIAGNOSIS — K200 Eosinophilic esophagitis: Secondary | ICD-10-CM | POA: Diagnosis not present

## 2022-04-29 HISTORY — DX: Other complications of anesthesia, initial encounter: T88.59XA

## 2022-04-29 HISTORY — PX: ESOPHAGOGASTRODUODENOSCOPY (EGD) WITH PROPOFOL: SHX5813

## 2022-04-29 LAB — POCT PREGNANCY, URINE: Preg Test, Ur: NEGATIVE

## 2022-04-29 SURGERY — ESOPHAGOGASTRODUODENOSCOPY (EGD) WITH PROPOFOL
Anesthesia: General

## 2022-04-29 MED ORDER — STERILE WATER FOR IRRIGATION IR SOLN
Status: DC | PRN
  Administered 2022-04-29: 50 mL

## 2022-04-29 MED ORDER — SODIUM CHLORIDE 0.9 % IV SOLN: INTRAVENOUS | Status: DC

## 2022-04-29 MED ORDER — LIDOCAINE HCL (CARDIAC) PF 100 MG/5ML IV SOSY
PREFILLED_SYRINGE | INTRAVENOUS | Status: DC | PRN
  Administered 2022-04-29: 80 mg via INTRAVENOUS

## 2022-04-29 MED ORDER — ONDANSETRON HCL 4 MG/2ML IJ SOLN: 4.0000 mg | Freq: Once | INTRAMUSCULAR | Status: DC | PRN

## 2022-04-29 MED ORDER — FENTANYL CITRATE PF 50 MCG/ML IJ SOSY: 25.0000 ug | PREFILLED_SYRINGE | INTRAMUSCULAR | Status: DC | PRN

## 2022-04-29 MED ORDER — LACTATED RINGERS IV SOLN: INTRAVENOUS | Status: DC

## 2022-04-29 MED ORDER — PROPOFOL 10 MG/ML IV BOLUS
INTRAVENOUS | Status: DC | PRN
  Administered 2022-04-29 (×3): 50 mg via INTRAVENOUS
  Administered 2022-04-29: 90 mg via INTRAVENOUS

## 2022-04-29 SURGICAL SUPPLY — 10 items
BALLN DILATOR 12-15 8 (BALLOONS) ×1
BALLOON DILATOR 12-15 8 (BALLOONS) IMPLANT
BLOCK BITE 60FR ADLT L/F GRN (MISCELLANEOUS) ×1 IMPLANT
FORCEPS BIOP RAD 4 LRG CAP 4 (CUTTING FORCEPS) IMPLANT
GOWN CVR UNV OPN BCK APRN NK (MISCELLANEOUS) ×2 IMPLANT
GOWN ISOL THUMB LOOP REG UNIV (MISCELLANEOUS) ×2
KIT PRC NS LF DISP ENDO (KITS) ×1 IMPLANT
KIT PROCEDURE OLYMPUS (KITS) ×1
MANIFOLD NEPTUNE II (INSTRUMENTS) ×1 IMPLANT
SYR INFLATION 60ML (SYRINGE) IMPLANT

## 2022-04-29 NOTE — Interval H&P Note (Signed)
Michele Lame, MD [facility] [address], [employer] [place], [place] [address] [phone] Fax : [phone]  Primary Care Physician:  Michele Fenton, NP Primary Gastroenterologist:  Dr. Allen Dunlap  Pre-Procedure History & Physical: HPI:  Michele Dunlap is a 22 y.o. female is here for an endoscopy.   Past Medical History:  Diagnosis Date   Allergy    Complication of anesthesia    Started to wake during EGD (age 52)   Migraine    couple times per week    Past Surgical History:  Procedure Laterality Date   ESOPHAGOGASTRODUODENOSCOPY  03/15/2018   [facility]   IR FL GUIDED LOC OF NEEDLE/CATH TIP FOR SPINAL INJECTION LT  03/31/2022    Prior to Admission medications   Medication Sig Start Date End Date Taking? Authorizing Provider  gabapentin (NEURONTIN) 300 MG capsule Take 300 mg by mouth 2 (two) times daily.   Yes [provider]  Norgestimate-Ethinyl Estradiol Triphasic (TRI-SPRINTEC) 0.18/0.215/0.25 MG-35 MCG tablet Take 1 tablet by mouth daily. 11/25/21  Yes Michele Fenton, NP  venlafaxine (EFFEXOR) 37.5 MG tablet Take 75 mg by mouth daily.   Yes [provider]  omeprazole (PRILOSEC) 20 MG capsule TAKE 1 CAPSULE BY MOUTH TWICE A DAY Patient not taking: Reported on 04/26/2022 03/29/22   Michele Fenton, NP  ondansetron (ZOFRAN) 4 MG tablet Take 1 tablet (4 mg total) by mouth every 6 (six) hours. Patient not taking: Reported on 04/26/2022 01/20/22   Michele Hensen, DO  traZODone (DESYREL) 50 MG tablet TAKE 0.5-1 TABLETS BY MOUTH AT BEDTIME AS NEEDED FOR SLEEP. Patient not taking: Reported on 04/26/2022 02/10/22   Michele Fenton, NP    Allergies as of 04/21/2022 - Review Complete 04/21/2022  Allergen Reaction Noted   Multihance [gadobenate] Nausea And Vomiting 03/10/2022    Family History  Problem Relation Age of Onset   Hypertension Mother    Healthy Father    Down syndrome Sister    Hypertension Maternal Grandmother    Hypertension Maternal Grandfather     Hypertension Paternal Grandmother    Hypertension Paternal Grandfather     Social History   Socioeconomic History   Marital status: Single    Spouse name: Not on file   Number of children: Not on file   Years of education: Not on file   Highest education level: Not on file  Occupational History   Not on file  Tobacco Use   Smoking status: Never   Smokeless tobacco: Never  Vaping Use   Vaping Use: Never used  Substance and Sexual Activity   Alcohol use: Never   Drug use: Never   Sexual activity: Yes  Other Topics Concern   Not on file  Social History Narrative   [name] is currently in 11th grade at [employer]. She lives at home with mother and sister. She has 3 dogs. She likes to dance.   Social Determinants of Health   Financial Resource Strain: Not on file  Food Insecurity: Not on file  Transportation Needs: Not on file  Physical Activity: Not on file  Stress: Not on file  Social Connections: Not on file  Intimate Partner Violence: Not on file    Review of Systems: See HPI, otherwise negative ROS  Physical Exam: BP 128/86   Pulse 74   Temp 98.7 F (37.1 C) (Temporal)   Resp 20   Ht 4\' 11"  (1.499 m)   Wt 65.8 kg   LMP 04/17/2022 (Approximate) Comment: negative pregnancy  test 03/31/22  SpO2 97%   BMI 29.29 kg/m  General:   Alert,  pleasant and cooperative in NAD Head:  Normocephalic and atraumatic. Neck:  Supple; no masses or thyromegaly. Lungs:  Clear throughout to auscultation.    Heart:  Regular rate and rhythm. Abdomen:  Soft, nontender and nondistended. Normal bowel sounds, without guarding, and without rebound.   Neurologic:  Alert and  oriented x4;  grossly normal neurologically.  Impression/Plan: Michele Dunlap is here for an endoscopy to be performed for dysphagia  Risks, benefits, limitations, and alternatives regarding  endoscopy have been reviewed with the patient.  Questions have been answered.  All parties agreeable.   Michele Lame, MD   04/29/2022, 9:55 AM

## 2022-04-29 NOTE — Anesthesia Preprocedure Evaluation (Signed)
Anesthesia Evaluation  Patient identified by MRN, date of birth, ID band Patient awake    Reviewed: Allergy & Precautions, H&P , NPO status , Patient's Chart, lab work & pertinent test results, reviewed documented beta blocker date and time   History of Anesthesia Complications (+) history of anesthetic complications  Airway Mallampati: II   Neck ROM: full    Dental  (+) Poor Dentition   Pulmonary neg pulmonary ROS   Pulmonary exam normal        Cardiovascular Exercise Tolerance: Good negative cardio ROS Normal cardiovascular exam Rhythm:regular Rate:Normal     Neuro/Psych  Headaches PSYCHIATRIC DISORDERS Anxiety Depression       GI/Hepatic negative GI ROS, Neg liver ROS,,,  Endo/Other  negative endocrine ROS    Renal/GU negative Renal ROS  negative genitourinary   Musculoskeletal   Abdominal   Peds  Hematology negative hematology ROS (+)   Anesthesia Other Findings Past Medical History: No date: Allergy No date: Complication of anesthesia     Comment:  Started to wake during EGD (age 22) No date: Migraine     Comment:  couple times per week Past Surgical History: 03/15/2018: ESOPHAGOGASTRODUODENOSCOPY     Comment:  [facility] 03/31/2022: IR FL GUIDED LOC OF NEEDLE/CATH TIP FOR SPINAL INJECTION  LT BMI    Body Mass Index: 29.49 kg/m     Reproductive/Obstetrics negative OB ROS                             Anesthesia Physical Anesthesia Plan  ASA: 2  Anesthesia Plan: General   Post-op Pain Management:    Induction:   PONV Risk Score and Plan:   Airway Management Planned:   Additional Equipment:   Intra-op Plan:   Post-operative Plan:   Informed Consent: I have reviewed the patients History and Physical, chart, labs and discussed the procedure including the risks, benefits and alternatives for the proposed anesthesia with the patient or authorized representative who has  indicated his/her understanding and acceptance.     Dental Advisory Given  Plan Discussed with: CRNA  Anesthesia Plan Comments:        Anesthesia Quick Evaluation

## 2022-04-29 NOTE — Anesthesia Postprocedure Evaluation (Signed)
Anesthesia Post Note  Patient: Michele Dunlap  Procedure(s) Performed: ESOPHAGOGASTRODUODENOSCOPY (EGD) WITH BIOPSY AND DILATION  Patient location during evaluation: PACU Anesthesia Type: General Level of consciousness: awake and alert Pain management: pain level controlled Vital Signs Assessment: post-procedure vital signs reviewed and stable Respiratory status: spontaneous breathing, nonlabored ventilation, respiratory function stable and patient connected to nasal cannula oxygen Cardiovascular status: blood pressure returned to baseline and stable Postop Assessment: no apparent nausea or vomiting Anesthetic complications: no   No notable events documented.   Last Vitals:  Vitals:   04/29/22 1030 04/29/22 1039  BP: 101/63 106/63  Pulse: 65 75  Resp: 15 16  Temp:  36.5 C  SpO2: 97% 98%    Last Pain:  Vitals:   04/29/22 1039  TempSrc:   PainSc: 0-No pain                 Molli Barrows

## 2022-04-29 NOTE — Transfer of Care (Signed)
Immediate Anesthesia Transfer of Care Note  Patient: Michele Dunlap  Procedure(s) Performed: ESOPHAGOGASTRODUODENOSCOPY (EGD) WITH BIOPSY AND DILATION  Patient Location: PACU  Anesthesia Type: General  Level of Consciousness: awake, alert  and patient cooperative  Airway and Oxygen Therapy: Patient Spontanous Breathing and Patient connected to supplemental oxygen  Post-op Assessment: Post-op Vital signs reviewed, Patient's Cardiovascular Status Stable, Respiratory Function Stable, Patent Airway and No signs of Nausea or vomiting  Post-op Vital Signs: Reviewed and stable  Complications: No notable events documented.

## 2022-04-29 NOTE — Op Note (Signed)
[facility] Patient Name: Michele Dunlap Procedure Date: 04/29/2022 10:00 AM MRN: [id number] Account #: [id number] Date of Birth: [birth year] Admit Type: Outpatient Age: 22 Room: [facility] OR ROOM 01 Gender: Female Note Status: Finalized Instrument Name: [id number] Procedure:             Upper GI endoscopy Indications:           Dysphagia Providers:             Lucilla Lame MD, MD Referring MD:          Jearld Fenton (Referring MD) Medicines:             Propofol per Anesthesia Complications:         No immediate complications. Procedure:             Pre-Anesthesia Assessment:                        - Prior to the procedure, a History and Physical was                         performed, and patient medications and allergies were                         reviewed. The patient's tolerance of previous                         anesthesia was also reviewed. The risks and benefits                         of the procedure and the sedation options and risks                         were discussed with the patient. All questions were                         answered, and informed consent was obtained. Prior                         Anticoagulants: The patient has taken no anticoagulant                         or antiplatelet agents. ASA Grade Assessment: II - A                         patient with mild systemic disease. After reviewing                         the risks and benefits, the patient was deemed in                         satisfactory condition to undergo the procedure.                        After obtaining informed consent, the endoscope was                         passed under direct vision. Throughout the procedure,  the patient's blood pressure, pulse, and oxygen                         saturations were monitored continuously. The was                         introduced through the mouth, and advanced to the                          second part of duodenum. The upper GI endoscopy was                         accomplished without difficulty. The patient tolerated                         the procedure well. Findings:      The examined esophagus was normal. A TTS dilator was passed through the       scope. Dilation with a 12-13.5-15 mm balloon dilator was performed to 15       mm. The dilation site was examined following endoscope reinsertion and       showed moderate improvement in luminal narrowing. Two biopsies were       obtained in the middle third of the esophagus with cold forceps for       histology.      The stomach was normal.      The examined duodenum was normal. Impression:            - Normal esophagus. Dilated.                        - Normal stomach.                        - Normal examined duodenum.                        - Two biopsies were obtained in the middle third of                         the esophagus. Recommendation:        - Discharge patient to home.                        - Resume previous diet.                        - Continue present medications.                        - Await pathology results. Procedure Code(s):     --- Professional ---                        [phone], Esophagogastroduodenoscopy, flexible,                         transoral; with transendoscopic balloon dilation of                         esophagus (less than 30 mm diameter)  [id number], 59, Esophagogastroduodenoscopy, flexible,                         transoral; with biopsy, single or multiple Diagnosis Code(s):     --- Professional ---                        R13.10, Dysphagia, unspecified CPT copyright 2022 American Medical Association. All rights reserved. The codes documented in this report are preliminary and upon coder review may  be revised to meet current compliance requirements. Lucilla Lame MD, MD 04/29/2022 10:17:50 AM This report has been signed electronically. Number of Addenda: 0 Note  Initiated On: 04/29/2022 10:00 AM Estimated Blood Loss:  Estimated blood loss: none.      [facility]

## 2022-05-02 ENCOUNTER — Encounter: Payer: Self-pay | Admitting: Gastroenterology

## 2022-05-03 LAB — SURGICAL PATHOLOGY

## 2022-05-04 ENCOUNTER — Other Ambulatory Visit: Payer: Self-pay | Admitting: Internal Medicine

## 2022-05-04 NOTE — Telephone Encounter (Signed)
Requested Prescriptions  Pending Prescriptions Disp Refills   traZODone (DESYREL) 50 MG tablet [Pharmacy Med Name: TRAZODONE 50 MG TABLET] 90 tablet 0    Sig: TAKE 1/2 TO 1 TABLET BY MOUTH AT BEDTIME AS NEEDED FOR SLEEP     Psychiatry: Antidepressants - Serotonin Modulator Passed - 05/04/2022  8:31 AM      Passed - Completed PHQ-2 or PHQ-9 in the last 360 days      Passed - Valid encounter within last 6 months    Recent Outpatient Visits           3 months ago Anxiety and depression   [facility] [place], [place] W, NP   5 months ago Overweight with body mass index (BMI) of 29 to 29.9 in adult   [place], Coralie Keens, NP   5 months ago Encounter for general adult medical examination with abnormal findings   [facility] [place], Coralie Keens, NP   10 years ago Pain of sternum   [facility], Gay Filler, MD   10 years ago Impetigo   [facility], Janalee Dane, MD       Future Appointments             In 4 weeks Baity, Coralie Keens, NP [facility], [facility]

## 2022-05-08 ENCOUNTER — Other Ambulatory Visit: Payer: Self-pay | Admitting: Internal Medicine

## 2022-05-08 DIAGNOSIS — Z0001 Encounter for general adult medical examination with abnormal findings: Secondary | ICD-10-CM

## 2022-05-09 ENCOUNTER — Encounter: Payer: Self-pay | Admitting: Gastroenterology

## 2022-05-09 NOTE — Telephone Encounter (Signed)
Requested Prescriptions  Pending Prescriptions Disp Refills   TRI-ESTARYLLA 0.18/0.215/0.25 MG-35 MCG tablet [Pharmacy Med Name: TRI-ESTARYLLA TABLET] 84 tablet 1    Sig: TAKE 1 TABLET BY MOUTH EVERY DAY     OB/GYN:  Contraceptives Passed - 05/08/2022  8:32 AM      Passed - Last BP in normal range    BP Readings from Last 1 Encounters:  04/29/22 106/63         Passed - Valid encounter within last 12 months    Recent Outpatient Visits           3 months ago Anxiety and depression   [facility] [place], [place] W, NP   5 months ago Overweight with body mass index (BMI) of 29 to 29.9 in adult   [facility], Coralie Keens, NP   5 months ago Encounter for general adult medical examination with abnormal findings   [facility] [place], Coralie Keens, NP   10 years ago Pain of sternum   [facility] Care at [facility], Gay Filler, MD   10 years ago Impetigo   [facility], Janalee Dane, MD       Future Appointments             In 3 weeks Baity, Coralie Keens, NP [facility], [place] - Patient is not a smoker

## 2022-05-10 ENCOUNTER — Other Ambulatory Visit: Payer: Self-pay

## 2022-05-10 DIAGNOSIS — K200 Eosinophilic esophagitis: Secondary | ICD-10-CM

## 2022-05-10 MED ORDER — OMEPRAZOLE 40 MG PO CPDR: 40.0000 mg | DELAYED_RELEASE_CAPSULE | Freq: Every day | ORAL | 1 refills | Status: DC

## 2022-05-17 ENCOUNTER — Encounter: Payer: Self-pay | Admitting: Internal Medicine

## 2022-05-17 DIAGNOSIS — R41840 Attention and concentration deficit: Secondary | ICD-10-CM

## 2022-05-26 ENCOUNTER — Other Ambulatory Visit: Payer: Self-pay | Admitting: Internal Medicine

## 2022-05-26 DIAGNOSIS — Z0001 Encounter for general adult medical examination with abnormal findings: Secondary | ICD-10-CM

## 2022-05-26 NOTE — Telephone Encounter (Signed)
Unable to refill per protocol, Rx request is too soon. Last refill 05/09/22 for 84 and 1 refill.  Requested Prescriptions  Pending Prescriptions Disp Refills   TRI-ESTARYLLA 0.18/0.215/0.25 MG-35 MCG tablet [Pharmacy Med Name: TRI-ESTARYLLA TABLET] 84 tablet 1    Sig: TAKE 1 TABLET BY MOUTH EVERY DAY     OB/GYN:  Contraceptives Passed - 05/26/2022 11:32 AM      Passed - Last BP in normal range    BP Readings from Last 1 Encounters:  04/29/22 106/63         Passed - Valid encounter within last 12 months    Recent Outpatient Visits           4 months ago Anxiety and depression   [facility] [place], [place] W, NP   5 months ago Overweight with body mass index (BMI) of 29 to 29.9 in adult   [place], Coralie Keens, NP   6 months ago Encounter for general adult medical examination with abnormal findings   [facility] [place], Coralie Keens, NP   10 years ago Pain of sternum   [facility] at [facility], Gay Filler, MD   10 years ago Impetigo   [facility], Janalee Dane, MD       Future Appointments             In 1 week Baity, Coralie Keens, NP [facility], [place] - Patient is not a smoker

## 2022-05-26 NOTE — Telephone Encounter (Signed)
Resending due to last sent on 05/09/22 did not send electronically.  Requested Prescriptions  Pending Prescriptions Disp Refills   TRI-ESTARYLLA 0.18/0.215/0.25 MG-35 MCG tablet [Pharmacy Med Name: TRI-ESTARYLLA TABLET] 84 tablet 1    Sig: TAKE 1 TABLET BY MOUTH EVERY DAY     OB/GYN:  Contraceptives Passed - 05/26/2022  2:27 PM      Passed - Last BP in normal range    BP Readings from Last 1 Encounters:  04/29/22 106/63         Passed - Valid encounter within last 12 months    Recent Outpatient Visits           4 months ago Anxiety and depression   [facility] [place], [place] W, NP   5 months ago Overweight with body mass index (BMI) of 29 to 29.9 in adult   [place], Coralie Keens, NP   6 months ago Encounter for general adult medical examination with abnormal findings   [facility] [place], Coralie Keens, NP   10 years ago Pain of sternum   [facility], Gay Filler, MD   10 years ago Impetigo   [facility], Janalee Dane, MD       Future Appointments             In 1 week Baity, Coralie Keens, NP [facility], [place] - Patient is not a smoker

## 2022-05-30 ENCOUNTER — Ambulatory Visit: Payer: Self-pay | Admitting: Internal Medicine

## 2022-06-02 ENCOUNTER — Ambulatory Visit (INDEPENDENT_AMBULATORY_CARE_PROVIDER_SITE_OTHER): Payer: No Typology Code available for payment source | Admitting: Internal Medicine

## 2022-06-02 ENCOUNTER — Encounter: Payer: Self-pay | Admitting: Internal Medicine

## 2022-06-02 VITALS — BP 100/70 | HR 80 | Temp 96.9°F | Wt 148.0 lb

## 2022-06-02 DIAGNOSIS — G609 Hereditary and idiopathic neuropathy, unspecified: Secondary | ICD-10-CM | POA: Insufficient documentation

## 2022-06-02 DIAGNOSIS — F419 Anxiety disorder, unspecified: Secondary | ICD-10-CM

## 2022-06-02 DIAGNOSIS — E7800 Pure hypercholesterolemia, unspecified: Secondary | ICD-10-CM | POA: Diagnosis not present

## 2022-06-02 DIAGNOSIS — Z6829 Body mass index (BMI) 29.0-29.9, adult: Secondary | ICD-10-CM

## 2022-06-02 DIAGNOSIS — K200 Eosinophilic esophagitis: Secondary | ICD-10-CM

## 2022-06-02 DIAGNOSIS — G43C1 Periodic headache syndromes in child or adult, intractable: Secondary | ICD-10-CM

## 2022-06-02 DIAGNOSIS — F5104 Psychophysiologic insomnia: Secondary | ICD-10-CM

## 2022-06-02 DIAGNOSIS — E663 Overweight: Secondary | ICD-10-CM

## 2022-06-02 DIAGNOSIS — F32A Depression, unspecified: Secondary | ICD-10-CM

## 2022-06-02 MED ORDER — NORGESTIM-ETH ESTRAD TRIPHASIC 0.18/0.215/0.25 MG-35 MCG PO TABS: 1.0000 | ORAL_TABLET | Freq: Every day | ORAL | 1 refills | Status: DC

## 2022-06-02 NOTE — Assessment & Plan Note (Signed)
Continue trazodone 

## 2022-06-02 NOTE — Assessment & Plan Note (Signed)
C-Met and lipid profile today Encouraged her to consume low-fat diet 

## 2022-06-02 NOTE — Progress Notes (Signed)
Subjective:    Patient ID: Michele Dunlap, female    DOB: 2000/05/18, 22 y.o.   MRN: [id number]  HPI  Patient presents to clinic today for follow-up of chronic conditions.  Migraines: Triggered by hormones.  She does not take any medication for this. She follows with neurology.  Eosinophilic Esophagitis: Managed with Omeprazole.  Upper GI from 04/2022 reviewed.  She follows with GI.  Anxiety and Depression: Chronic, managed on Venlafaxine.  She is not currently seeing a therapist.  She denies SI/HI.  Insomnia: She has difficulty falling asleep.  She takes Trazodone as prescribed.  There is no sleep study on file.  HLD: Her last LDL was 109, triglycerides 100, 11/2021.  She is not taking any cholesterol-lowering medication at this time.  She does not consume low-fat diet.  Idiopathic Neuropathy: Managed with Gabapentin. She follows with neurology.  Review of Systems     Past Medical History:  Diagnosis Date   Allergy    Complication of anesthesia    Started to wake during EGD (age 59)   Migraine    couple times per week    Current Outpatient Medications  Medication Sig Dispense Refill   gabapentin (NEURONTIN) 300 MG capsule Take 300 mg by mouth 2 (two) times daily.     omeprazole (PRILOSEC) 40 MG capsule Take 1 capsule (40 mg total) by mouth daily. 90 capsule 1   ondansetron (ZOFRAN) 4 MG tablet Take 1 tablet (4 mg total) by mouth every 6 (six) hours. (Patient not taking: Reported on 04/26/2022) 12 tablet 0   traZODone (DESYREL) 50 MG tablet TAKE 1/2 TO 1 TABLET BY MOUTH AT BEDTIME AS NEEDED FOR SLEEP 90 tablet 0   TRI-ESTARYLLA 0.18/0.215/0.25 MG-35 MCG tablet TAKE 1 TABLET BY MOUTH EVERY DAY 84 tablet 1   venlafaxine (EFFEXOR) 37.5 MG tablet Take 75 mg by mouth daily.     No current facility-administered medications for this visit.    Allergies  Allergen Reactions   Multihance [Gadobenate] Nausea And Vomiting    Family History  Problem Relation Age of Onset    Hypertension Mother    Healthy Father    Down syndrome Sister    Hypertension Maternal Grandmother    Hypertension Maternal Grandfather    Hypertension Paternal Grandmother    Hypertension Paternal Grandfather     Social History   Socioeconomic History   Marital status: Single    Spouse name: Not on file   Number of children: Not on file   Years of education: Not on file   Highest education level: Not on file  Occupational History   Not on file  Tobacco Use   Smoking status: Never   Smokeless tobacco: Never  Vaping Use   Vaping Use: Never used  Substance and Sexual Activity   Alcohol use: Never   Drug use: Never   Sexual activity: Yes  Other Topics Concern   Not on file  Social History Narrative   Michele Dunlap is currently in 11th grade at [employer]. She lives at home with mother and sister. She has 3 dogs. She likes to dance.   Social Determinants of Health   Financial Resource Strain: Not on file  Food Insecurity: Not on file  Transportation Needs: Not on file  Physical Activity: Not on file  Stress: Not on file  Social Connections: Not on file  Intimate Partner Violence: Not on file     Constitutional: Patient reports intermittent headaches.  Denies fever, malaise, fatigue, or abrupt  weight changes.  HEENT: Denies eye pain, eye redness, ear pain, ringing in the ears, wax buildup, runny nose, nasal congestion, bloody nose, or sore throat. Respiratory: Denies difficulty breathing, shortness of breath, cough or sputum production.   Cardiovascular: Denies chest pain, chest tightness, palpitations or swelling in the hands or feet.  Gastrointestinal: Denies abdominal pain, bloating, constipation, diarrhea or blood in the stool.  GU: Denies urgency, frequency, pain with urination, burning sensation, blood in urine, odor or discharge. Musculoskeletal: Denies decrease in range of motion, difficulty with gait, muscle pain or joint pain and swelling.  Skin: Denies redness,  rashes, lesions or ulcercations.  Neurological: Patient reports inattention, insomnia, neuropathic pain.  Denies dizziness, difficulty with memory, difficulty with speech or problems with balance and coordination.  Psych: Patient has a history of anxiety and depression.  Denies SI/HI.  No other specific complaints in a complete review of systems (except as listed in HPI above).  Objective:   Physical Exam  BP 100/70 (BP Location: Right Arm, Patient Position: Sitting, Cuff Size: Normal)   Pulse 80   Temp (!) 96.9 F (36.1 C) (Temporal)   Wt 148 lb (67.1 kg)   SpO2 99%   BMI 29.89 kg/m   Wt Readings from Last 3 Encounters:  04/29/22 145 lb (65.8 kg)  04/21/22 146 lb (66.2 kg)  03/31/22 135 lb (61.2 kg)    General: Appears her stated age, overweight, in NAD. Skin: Warm, dry and intact.  HEENT: Head: normal shape and size; Eyes: sclera white, no icterus, conjunctiva pink, PERRLA and EOMs intact;  Cardiovascular: Normal rate and rhythm. S1,S2 noted.  No murmur, rubs or gallops noted.  Pulmonary/Chest: Normal effort and positive vesicular breath sounds. No respiratory distress. No wheezes, rales or ronchi noted.  Abdomen: Soft and nontender. Normal bowel sounds.  Musculoskeletal: No difficulty with gait.  Neurological: Alert and oriented. Coordination normal.  Psychiatric: Mood and affect mildly flat. Behavior is normal. Judgment and thought content normal.    BMET    Component Value Date/Time   NA 138 11/25/2021 1018   K 4.5 11/25/2021 1018   CL 105 11/25/2021 1018   CO2 23 11/25/2021 1018   GLUCOSE 93 11/25/2021 1018   BUN 11 11/25/2021 1018   CREATININE 0.73 11/25/2021 1018   CALCIUM 9.6 11/25/2021 1018   GFRNONAA NOT CALCULATED 02/02/2018 0039   GFRAA NOT CALCULATED 02/02/2018 0039    Lipid Panel     Component Value Date/Time   CHOL 190 11/25/2021 1018   TRIG 100 11/25/2021 1018   HDL 60 11/25/2021 1018   CHOLHDL 3.2 11/25/2021 1018   LDLCALC 109 (H) 11/25/2021  1018    CBC    Component Value Date/Time   WBC 8.8 11/25/2021 1018   RBC 4.71 11/25/2021 1018   HGB 13.7 11/25/2021 1018   HCT 41.7 11/25/2021 1018   PLT 305 11/25/2021 1018   MCV 88.5 11/25/2021 1018   MCH 29.1 11/25/2021 1018   MCHC 32.9 11/25/2021 1018   RDW 12.3 11/25/2021 1018   LYMPHSABS 1.8 02/02/2018 0039   MONOABS 1.7 (H) 02/02/2018 0039   EOSABS 0.1 02/02/2018 0039   BASOSABS 0.1 02/02/2018 0039    Hgb A1C Lab Results  Component Value Date   HGBA1C 4.9 11/25/2021           Assessment & Plan:      RTC in 6 months for annual exam [name], NP

## 2022-06-02 NOTE — Assessment & Plan Note (Signed)
Stable on venlafaxine Support offered 

## 2022-06-02 NOTE — Assessment & Plan Note (Signed)
Encourage diet and exercise for weight loss 

## 2022-06-02 NOTE — Patient Instructions (Signed)

## 2022-06-02 NOTE — Assessment & Plan Note (Signed)
Okay to take Tylenol OTC as needed

## 2022-06-02 NOTE — Assessment & Plan Note (Signed)
Continue omeprazole 

## 2022-06-02 NOTE — Assessment & Plan Note (Signed)
Continue gabapentin She will continue to follow with neurology

## 2022-06-03 LAB — COMPLETE METABOLIC PANEL WITH GFR
AG Ratio: 1.6 (calc) (ref 1.0–2.5)
ALT: 16 U/L (ref 6–29)
AST: 17 U/L (ref 10–30)
Albumin: 4.3 g/dL (ref 3.6–5.1)
Alkaline phosphatase (APISO): 76 U/L (ref 31–125)
BUN: 10 mg/dL (ref 7–25)
CO2: 25 mmol/L (ref 20–32)
Calcium: 9.2 mg/dL (ref 8.6–10.2)
Chloride: 105 mmol/L (ref 98–110)
Creat: 0.74 mg/dL (ref 0.50–0.96)
Globulin: 2.7 g/dL (calc) (ref 1.9–3.7)
Glucose, Bld: 115 mg/dL — ABNORMAL HIGH (ref 65–99)
Potassium: 4.2 mmol/L (ref 3.5–5.3)
Sodium: 139 mmol/L (ref 135–146)
Total Bilirubin: 0.6 mg/dL (ref 0.2–1.2)
Total Protein: 7 g/dL (ref 6.1–8.1)
eGFR: 119 mL/min/{1.73_m2} (ref >=60)

## 2022-06-03 LAB — LIPID PANEL
Cholesterol: 181 mg/dL (ref <200)
HDL: 57 mg/dL (ref >=50)
LDL Cholesterol (Calc): 101 mg/dL (calc) — ABNORMAL HIGH
Non-HDL Cholesterol (Calc): 124 mg/dL (calc) (ref <130)
Total CHOL/HDL Ratio: 3.2 (calc) (ref <5.0)
Triglycerides: 124 mg/dL (ref <150)

## 2022-06-05 ENCOUNTER — Encounter: Payer: Self-pay | Admitting: Internal Medicine

## 2022-06-05 DIAGNOSIS — E7800 Pure hypercholesterolemia, unspecified: Secondary | ICD-10-CM

## 2022-06-06 MED ORDER — NORGESTIM-ETH ESTRAD TRIPHASIC 0.18/0.215/0.25 MG-35 MCG PO TABS: 1.0000 | ORAL_TABLET | Freq: Every day | ORAL | 1 refills | Status: DC

## 2022-06-06 NOTE — Telephone Encounter (Deleted)
I have sent this in for you.  Please let me know if you have trouble getting if filled.   Thanks,   -Mickel Baas       Disp Refills Start End   Norgestimate-Ethinyl Estradiol Triphasic (TRI-ESTARYLLA) 0.18/0.215/0.25 MG-35 MCG tablet 84 tablet 1 06/06/2022    Sig - Route: Take 1 tablet by mouth daily. - Oral   Sent to pharmacy as: Norgestimate-Ethinyl Estradiol Triphasic (TRI-ESTARYLLA) 0.18/0.215/0.25 MG-35 MCG tablet   E-Prescribing Status: Receipt confirmed by pharmacy (06/06/2022  8:14 AM EST)

## 2022-06-15 ENCOUNTER — Encounter: Payer: Self-pay | Admitting: Gastroenterology

## 2022-06-15 ENCOUNTER — Ambulatory Visit: Payer: No Typology Code available for payment source | Admitting: Psychology

## 2022-06-15 DIAGNOSIS — F89 Unspecified disorder of psychological development: Secondary | ICD-10-CM | POA: Diagnosis not present

## 2022-06-15 NOTE — Progress Notes (Addendum)
Date: 06/15/2022 Appointment Start Time: 2pm Duration: 85 minutes Provider: [name], PsyD Type of Session: Initial Appointment for Evaluation  Location of Patient: Home Location of Provider: Provider's Home (private office) Type of Contact: WebEx video visit with audio  Session Content:  Prior to proceeding with today's appointment, two pieces of identifying information were obtained from [place] to verify identity. In addition, Trenda's physical location at the time of this appointment was obtained. In the event of technical difficulties, Dene shared a phone number she could be reached at. [name] and this provider participated in today's telepsychological service. Bethzaida denied anyone else being present in the room or on the virtual appointment.  The provider's role was explained to [place]. The provider reviewed and discussed issues of confidentiality, privacy, and limits therein (e.g., reporting obligations). In addition to verbal informed consent, written informed consent for psychological services was obtained from [place] prior to the initial appointment. Written consent included information concerning the practice, financial arrangements, and confidentiality and patients' rights. Since the clinic is not a 24/7 crisis center, mental health emergency resources were shared, and the provider explained e-mail, voicemail, and/or other messaging systems should be utilized only for non-emergency reasons. This provider also explained that information obtained during appointments will be placed in their electronic medical record in a confidential manner. Statia verbally acknowledged understanding of the aforementioned and agreed to use mental health emergency resources discussed if needed. Moreover, Naleigha agreed information may be shared with other [facility] or their referring provider(s) as needed for coordination of care. By signing the new patient documents, Lakia provided written consent for coordination  of care. Mercadies verbally acknowledged understanding she is ultimately responsible for understanding her insurance benefits as it relates to reimbursement of telepsychological and in-person services. This provider also reviewed confidentiality, as it relates to telepsychological services, as well as the rationale for telepsychological services. This provider further explained that video should not be captured, photos should not be taken, nor should testing stimuli be copied or recorded as it would be a copyright violation. Raelene expressed understanding of the aforementioned, and verbally consented to proceed.  Tanikia completed the psychiatric diagnostic evaluation (history, including past, family, social, and  psychiatric history; behavioral observations; and establishment of a provisional diagnosis). The evaluation was completed in 85 minutes. Code [phone] was billed.   Mental Status Examination:  Appearance:  neat Behavior: appropriate to circumstances Mood: neutral Affect: mood congruent Speech: WNL Eye Contact: appropriate Psychomotor Activity: WNL Thought Process: linear, logical, and goal directed and denies suicidal, homicidal, and self-harm ideation, plan and intent Content/Perceptual Disturbances: none Orientation: AAOx4 Cognition/Sensorium: intact Insight: good Judgment: good  Provisional DSM-5 diagnosis(es):  F89 Unspecified Disorder of Psychological Development   Plan: Testing is expected to answer the question, does the individual meet criteria for ADHD when age, other mental health concerns (e.g., anxiety) and potential neurological conditions, and cognitive functioning are taken into consideration? Further testing is warranted because a diagnosis cannot be given solely based on current interview data (further data is required). Testing results are expected to answer the remaining diagnostic questions in order to provide an accurate diagnosis and assist in treatment planning with an  expectation of improved clinical outcome. Anntonette is currently scheduled for an appointment on 06/23/2022 at 10am via WebEx video visit with audio.       CONFIDENTIAL PSYCHOLOGICAL EVALUATION ______________________________________________________________________________ Name: Gearldine Bienenstock   Date of Birth: October 31, 2000    Age: 22  SOURCE AND REASON FOR REFERRAL: Ms. Mandilyn Villafana was referred  by NP [name] for an evaluation to ascertain if she meets criteria for Attention Deficit/Hyperactivity Disorder (ADHD).    BACKGROUND INFORMATION AND PRESENTING PROBLEM: Ms. Ameelia Stampone is a 22 year old female who resides in [place]  Ms. Forseth reported an unspecified provider recommended she pursue an ADHD evaluation secondary to her endorsed "trouble focusing" and difficulty "doing a whole lot of stuff in general." She described her ADHD-related concerns as having occurred since childhood and including "always" having been easily distracted but it has become exacerbated recently, although she noted uncertainty on why this has occurred but indicated it may be related to her potentially having multiple sclerosis and the difficulty of her current college classes; regular task initiation (e.g., feeling "overwhelmed by everything that needs to be done" which contributes to trouble starting tasks) and maintenance (e.g., becoming distracted by other tasks or thoughts which often leads to her ceasing the initial task prior to completion) issues; trouble relaxing as she commonly feels she needs to be moving or doing something; regular forgetfulness of a variety of things, including details of an appointment, what she was doing or going to say, and where she placed items which can cause irritability; regular making of mistakes or missing details as well as being prone to starting tasks without fully understanding the directions, which can cause her spending considerable time and energy "attempting to  solve" a task before realizing she was missing a detail or she had not input needed information; trouble sustaining her attention while others are talking which has led to some people making comments to her about it despite her efforts to sustain her attention; disorganization that causes clutter at her home and in her car; trouble sticking to plans and commitments as she often forgets them or experiences decreased motivation to follow through with them once the time comes; habitual fidgeting or squirming (e.g., playing with various items, "messing" with her dogs hair, looking around a room, and cracking her hands) and feeling restless when she has to stay seated; commonly having urges to interrupt others as she worries she will forget what she wants to say and/or she will continue to think about what she wants to say which negatively impacts her ability to listen; tending to engage in excessive talking as she tends to "chain" stories together which leads to her "talking for a really long time" despite a desire to hear from others; experiencing agitation if required to wait for something she "wants to keep [her] focus on" which she attributed to worries she will forget what she wants to do (e.g., waiting her turn during a board game); problems following proper order or sequence of tasks (e.g., recipes and instructions for classes with labs) as she tries to find the "quickest way" to finish a task, which can contribute to mistakes being made; and becoming overwhelmed when there is a lot of stimuli which she attributed to her brain not filtering out irrelevant stimuli. She also described having previously been diagnosed with OCD which she attributed to compulsive behaviors (e.g., "needing" to lock her car three times and to see the lights on her car flash) and beliefs something bad would happen if she did not engage in the compulsion, although noted how use of various coping strategies have reduced the frequency and  duration of her checking behaviors; anxiety; and hypervigilance about safety concerns and trouble sleeping if no one else is awake, which she attributed to her mother's fianc having died by suicide while her and her  family were sleeping and a belief she "could have saved him" if she was awake. She expressed a belief her ADHD-related concerns are consistent and independent of mood but can be exacerbated by stress. She stated her coping strategies include taking a picture of something that she may later worry she had not done (e.g., her door having been locked), utilizing visual reminders and a planner, and putting a limit on the number of times she can "check" something.   Ms. Fornash denied awareness of ever experiencing developmental milestone delays, grade repetition, or learning disability diagnosis. She discussed throughout schooling she was in "advanced classes," received primarily "As" with occasional "Bs," and did not experience disciplinary actions for behavioral concerns; however, she stated she found it harder to sustain her attention in class and was prone to putting off tasks which she attributed to the reduced structure and increased requirement of completing assignments outside of school as she found the school setting to be less distracting. She shared in college she started receiving "Cs," expressing a belief that she generally understands the class material but is struggling with the reduced structure making it easier to become distracted by things outside of her class requirements. 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   Ms. Goble reported her medical history is significant for a potential "neurological condition." She denied having a history of seizures or head injury. She reported past use of mental health services after her mother's fianc died and experiencing a psychiatric  hospitalization after having taken "more than [she] was supposed to" of an unspecified medication that she described as an effort to sleep due to sleeping difficulties at that time (versus a self-harm attempt). She also reported current use of an anxiolytic that she described as reducing her anxiety but not improving her ADHD-related concerns. She noted current use of a Celsius energy drink and an occasional 12oz soda, describing caffeine as helpful for fatigue and inattention. She denied use of all other recreational and illicit substances. She also denied ever meeting full criteria for a depressive episode; hypomanic or manic episode; overeating- and emotional eating-related behaviors (e.g., "eat[ing] all the time" and until feeling physically sick when younger and eating more than needed for "comfort" or because she had skipped breakfast); trauma- and stressor-related disorder; and suicidal or homicidal ideation, plan, or intent. She noted her familial mental health history is significant for depression (mother and father), anxiety (mother), posttraumatic stress disorder (PTSD) (mother), unspecified problematic substance use (father), and ADHD (mother and father).   Chart Review: On 11/25/2021 appointment note, NP [name] stated "[Ms. Noe] reports inattention. Denies dizziness, difficulty with memory, difficulty with speech or problems with balance and coordination." She also reported Ms. Mckeague has a "history of anxiety and depression" as well as "compulsive behavior."            Dolores Lory, PsyD

## 2022-06-16 ENCOUNTER — Ambulatory Visit: Payer: No Typology Code available for payment source | Admitting: Registered Nurse

## 2022-06-16 ENCOUNTER — Encounter
Admission: RE | Disposition: A | Discharge status: Home / Self Care | Payer: Self-pay | Source: Ambulatory Visit | Attending: Gastroenterology

## 2022-06-16 ENCOUNTER — Ambulatory Visit
Admission: RE | Admit: 2022-06-16 | Discharge: 2022-06-16 | Disposition: A | Discharge status: Home / Self Care | Payer: No Typology Code available for payment source | Source: Ambulatory Visit | Attending: Gastroenterology | Admitting: Gastroenterology

## 2022-06-16 ENCOUNTER — Encounter: Payer: Self-pay | Admitting: Gastroenterology

## 2022-06-16 DIAGNOSIS — R1310 Dysphagia, unspecified: Secondary | ICD-10-CM | POA: Insufficient documentation

## 2022-06-16 DIAGNOSIS — K200 Eosinophilic esophagitis: Secondary | ICD-10-CM | POA: Insufficient documentation

## 2022-06-16 DIAGNOSIS — K222 Esophageal obstruction: Secondary | ICD-10-CM | POA: Diagnosis not present

## 2022-06-16 DIAGNOSIS — R1319 Other dysphagia: Secondary | ICD-10-CM | POA: Diagnosis present

## 2022-06-16 HISTORY — PX: ESOPHAGOGASTRODUODENOSCOPY (EGD) WITH PROPOFOL: SHX5813

## 2022-06-16 LAB — POCT PREGNANCY, URINE: Preg Test, Ur: NEGATIVE

## 2022-06-16 SURGERY — ESOPHAGOGASTRODUODENOSCOPY (EGD) WITH PROPOFOL
Anesthesia: General

## 2022-06-16 MED ORDER — DEXMEDETOMIDINE HCL 200 MCG/2ML IV SOLN
INTRAVENOUS | Status: DC | PRN
  Administered 2022-06-16: 12 ug via INTRAVENOUS

## 2022-06-16 MED ORDER — PROPOFOL 500 MG/50ML IV EMUL
INTRAVENOUS | Status: DC | PRN
  Administered 2022-06-16: 237.389 ug/kg/min via INTRAVENOUS

## 2022-06-16 MED ORDER — GLYCOPYRROLATE 0.2 MG/ML IJ SOLN
INTRAMUSCULAR | Status: DC | PRN
  Administered 2022-06-16: .2 mg via INTRAVENOUS

## 2022-06-16 MED ORDER — LIDOCAINE HCL (CARDIAC) PF 100 MG/5ML IV SOSY
PREFILLED_SYRINGE | INTRAVENOUS | Status: DC | PRN
  Administered 2022-06-16: 100 mg via INTRAVENOUS

## 2022-06-16 MED ORDER — SODIUM CHLORIDE 0.9 % IV SOLN: INTRAVENOUS | Status: DC

## 2022-06-16 MED ORDER — PROPOFOL 10 MG/ML IV BOLUS
INTRAVENOUS | Status: DC | PRN
  Administered 2022-06-16: 80 mg via INTRAVENOUS
  Administered 2022-06-16: 70 mg via INTRAVENOUS
  Administered 2022-06-16: 100 mg via INTRAVENOUS

## 2022-06-16 NOTE — H&P (Signed)
Michele Lame, MD [facility] [address], Michele Dunlap, [place] [address] [phone] Fax : [phone]  Primary Care Physician:  Michele Fenton, NP Primary Gastroenterologist:  Dr. Allen Dunlap  Pre-Procedure History & Physical: HPI:  Michele Dunlap is a 22 y.o. female is here for an endoscopy.   Past Medical History:  Diagnosis Date   Allergy    Complication of anesthesia    Started to wake during EGD (age 73)   Migraine    couple times per week    Past Surgical History:  Procedure Laterality Date   ESOPHAGOGASTRODUODENOSCOPY  03/15/2018   [facility]   ESOPHAGOGASTRODUODENOSCOPY (EGD) WITH PROPOFOL N/A 04/29/2022   Procedure: ESOPHAGOGASTRODUODENOSCOPY (EGD) WITH BIOPSY AND DILATION;  Surgeon: Michele Lame, MD;  Location: [facility];  Service: Endoscopy;  Laterality: N/A;  DILATION 12-15MM   IR FL GUIDED LOC OF NEEDLE/CATH TIP FOR SPINAL INJECTION LT  03/31/2022    Prior to Admission medications   Medication Sig Start Date End Date Taking? Authorizing Provider  gabapentin (NEURONTIN) 300 MG capsule Take 300 mg by mouth 2 (two) times daily.   Yes [provider]  Norgestimate-Ethinyl Estradiol Triphasic (TRI-ESTARYLLA) 0.18/0.215/0.25 MG-35 MCG tablet Take 1 tablet by mouth daily. 06/06/22  Yes Michele Fenton, NP  omeprazole (PRILOSEC) 40 MG capsule Take 1 capsule (40 mg total) by mouth daily. 05/10/22  Yes Michele Lame, MD  venlafaxine (EFFEXOR) 37.5 MG tablet Take 75 mg by mouth daily.   Yes [provider]  traZODone (DESYREL) 50 MG tablet TAKE 1/2 TO 1 TABLET BY MOUTH AT BEDTIME AS NEEDED FOR SLEEP 05/04/22   Michele Fenton, NP    Allergies as of 05/10/2022 - Review Complete 04/29/2022  Allergen Reaction Noted   Multihance [gadobenate] Nausea And Vomiting 03/10/2022    Family History  Problem Relation Age of Onset   Hypertension Mother    Healthy Father    Down syndrome Sister    Hypertension Maternal Grandmother    Hypertension Maternal  Grandfather    Hypertension Paternal Grandmother    Hypertension Paternal Grandfather     Social History   Socioeconomic History   Marital status: Single    Spouse name: Not on file   Number of children: Not on file   Years of education: Not on file   Highest education level: Not on file  Occupational History   Not on file  Tobacco Use   Smoking status: Never   Smokeless tobacco: Never  Vaping Use   Vaping Use: Never used  Substance and Sexual Activity   Alcohol use: Never   Drug use: Never   Sexual activity: Yes  Other Topics Concern   Not on file  Social History Narrative   Michele Dunlap is currently in 11th grade at [employer]. She lives at home with mother and sister. She has 3 dogs. She likes to dance.   Social Determinants of Health   Financial Resource Strain: Not on file  Food Insecurity: Not on file  Transportation Needs: Not on file  Physical Activity: Not on file  Stress: Not on file  Social Connections: Not on file  Intimate Partner Violence: Not on file    Review of Systems: See HPI, otherwise negative ROS  Physical Exam: BP 118/81   Pulse 69   Temp (!) 96.8 F (36 C) (Temporal)   Resp 16   Ht 4' 11"$  (1.499 m)   Wt 67.4 kg   SpO2 100%   BMI 30.01 kg/m  General:  Alert,  pleasant and cooperative in NAD Head:  Normocephalic and atraumatic. Neck:  Supple; no masses or thyromegaly. Lungs:  Clear throughout to auscultation.    Heart:  Regular rate and rhythm. Abdomen:  Soft, nontender and nondistended. Normal bowel sounds, without guarding, and without rebound.   Neurologic:  Alert and  oriented x4;  grossly normal neurologically.  Impression/Plan: Michele Dunlap is here for an endoscopy to be performed for dyshagia  Risks, benefits, limitations, and alternatives regarding  endoscopy have been reviewed with the patient.  Questions have been answered.  All parties agreeable.   Michele Lame, MD  06/16/2022, 10:48 AM

## 2022-06-16 NOTE — Op Note (Signed)
[facility] Patient Name: Michele Dunlap Procedure Date: 06/16/2022 10:45 AM MRN: [id number] Account #: [id number] Date of Birth: [birth year] Admit Type: Outpatient Age: 22 Room: [facility] ENDO ROOM 3 Gender: Female Note Status: Finalized Instrument Name: Altamese Cabal Endoscope [id number] Procedure:             Upper GI endoscopy Indications:           Dysphagia Providers:             Lucilla Lame MD, MD Referring MD:          Jearld Fenton (Referring MD) Medicines:             Propofol per Anesthesia Complications:         No immediate complications. Procedure:             Pre-Anesthesia Assessment:                        - Prior to the procedure, a History and Physical was                         performed, and patient medications and allergies were                         reviewed. The patient's tolerance of previous                         anesthesia was also reviewed. The risks and benefits                         of the procedure and the sedation options and risks                         were discussed with the patient. All questions were                         answered, and informed consent was obtained. Prior                         Anticoagulants: The patient has taken no anticoagulant                         or antiplatelet agents. ASA Grade Assessment: II - A                         patient with mild systemic disease. After reviewing                         the risks and benefits, the patient was deemed in                         satisfactory condition to undergo the procedure.                        After obtaining informed consent, the endoscope was                         passed under direct vision. Throughout the procedure,  the patient's blood pressure, pulse, and oxygen                         saturations were monitored continuously. The Endoscope                         was introduced through the mouth, and advanced to the                          second part of duodenum. The upper GI endoscopy was                         accomplished without difficulty. The patient tolerated                         the procedure well. Findings:      One benign-appearing, intrinsic mild stenosis was found at the       gastroesophageal junction. The stenosis was traversed. A TTS dilator was       passed through the scope. Dilation with a 15-16.5-18 mm balloon dilator       was performed to 18 mm. The dilation site was examined following       endoscope reinsertion and showed complete resolution of luminal       narrowing. Two biopsies were obtained in the middle third of the       esophagus with cold forceps for histology.      The stomach was normal.      The examined duodenum was normal. Impression:            - Benign-appearing esophageal stenosis. Dilated.                        - Normal stomach.                        - Normal examined duodenum.                        - Two biopsies were obtained in the middle third of                         the esophagus. Recommendation:        - Discharge patient to home.                        - Resume previous diet.                        - Continue present medications.                        - Await pathology results. Procedure Code(s):     --- Professional ---                        [phone], Esophagogastroduodenoscopy, flexible,                         transoral; with transendoscopic balloon dilation of  esophagus (less than 30 mm diameter)                        43239, 59, Esophagogastroduodenoscopy, flexible,                         transoral; with biopsy, single or multiple Diagnosis Code(s):     --- Professional ---                        R13.10, Dysphagia, unspecified                        K22.2, Esophageal obstruction CPT copyright 2022 American Medical Association. All rights reserved. The codes documented in this report are preliminary and upon  coder review may  be revised to meet current compliance requirements. Lucilla Lame MD, MD 06/16/2022 11:06:47 AM This report has been signed electronically. Number of Addenda: 0 Note Initiated On: 06/16/2022 10:45 AM Estimated Blood Loss:  Estimated blood loss: none.      [facility]

## 2022-06-16 NOTE — Anesthesia Postprocedure Evaluation (Signed)
Anesthesia Post Note  Patient: Michele Dunlap  Procedure(s) Performed: ESOPHAGOGASTRODUODENOSCOPY (EGD) WITH PROPOFOL  Patient location during evaluation: PACU Anesthesia Type: General Level of consciousness: awake and awake and alert Pain management: pain level controlled Vital Signs Assessment: post-procedure vital signs reviewed and stable Respiratory status: spontaneous breathing Cardiovascular status: stable Anesthetic complications: no   No notable events documented.   Last Vitals:  Vitals:   06/16/22 0949 06/16/22 1111  BP: 118/81 96/60  Pulse: 69 75  Resp: 16   Temp: (!) 36 C (!) 35.9 C  SpO2: 100% 97%    Last Pain:  Vitals:   06/16/22 1111  TempSrc: Temporal  PainSc: Asleep                 VAN STAVEREN,Kiasha Bellin

## 2022-06-16 NOTE — Anesthesia Preprocedure Evaluation (Signed)
Anesthesia Evaluation  Patient identified by MRN, date of birth, ID band Patient awake    Reviewed: Allergy & Precautions, NPO status , Patient's Chart, lab work & pertinent test results  Airway Mallampati: II  TM Distance: >3 FB Neck ROM: Full    Dental  (+) Teeth Intact   Pulmonary neg pulmonary ROS   Pulmonary exam normal breath sounds clear to auscultation       Cardiovascular Exercise Tolerance: Good negative cardio ROS Normal cardiovascular exam Rhythm:Regular Rate:Normal     Neuro/Psych  Headaches  Anxiety     negative neurological ROS  negative psych ROS   GI/Hepatic negative GI ROS, Neg liver ROS,,,  Endo/Other  negative endocrine ROS    Renal/GU negative Renal ROS  negative genitourinary   Musculoskeletal   Abdominal Normal abdominal exam  (+)   Peds negative pediatric ROS (+)  Hematology negative hematology ROS (+)   Anesthesia Other Findings Past Medical History: No date: Allergy No date: Complication of anesthesia     Comment:  Started to wake during EGD (age 22) No date: Migraine     Comment:  couple times per week  Past Surgical History: 03/15/2018: ESOPHAGOGASTRODUODENOSCOPY     Comment:  [facility] 04/29/2022: ESOPHAGOGASTRODUODENOSCOPY (EGD) WITH PROPOFOL; N/A     Comment:  Procedure: ESOPHAGOGASTRODUODENOSCOPY (EGD) WITH BIOPSY               AND DILATION;  Surgeon: Lucilla Lame, MD;  Location:               [place];  Service: Endoscopy;  Laterality:               N/A;  DILATION 12-15MM 03/31/2022: IR FL GUIDED LOC OF NEEDLE/CATH TIP FOR SPINAL INJECTION  LT  BMI    Body Mass Index: 30.01 kg/m      Reproductive/Obstetrics negative OB ROS                             Anesthesia Physical Anesthesia Plan  ASA: 2  Anesthesia Plan: General   Post-op Pain Management:    Induction: Intravenous  PONV Risk Score and Plan: Propofol infusion and  TIVA  Airway Management Planned: Natural Airway and Nasal Cannula  Additional Equipment:   Intra-op Plan:   Post-operative Plan:   Informed Consent: I have reviewed the patients History and Physical, chart, labs and discussed the procedure including the risks, benefits and alternatives for the proposed anesthesia with the patient or authorized representative who has indicated his/her understanding and acceptance.     Dental Advisory Given  Plan Discussed with: Anesthesiologist, CRNA and Surgeon  Anesthesia Plan Comments:        Anesthesia Quick Evaluation

## 2022-06-16 NOTE — Transfer of Care (Signed)
Immediate Anesthesia Transfer of Care Note  Patient: Michele Dunlap  Procedure(s) Performed: ESOPHAGOGASTRODUODENOSCOPY (EGD) WITH PROPOFOL  Patient Location: Endoscopy Unit  Anesthesia Type:General  Level of Consciousness: drowsy  Airway & Oxygen Therapy: Patient Spontanous Breathing  Post-op Assessment: Report given to RN  Post vital signs: Reviewed and stable  Last Vitals: see EPIC flowsheet Vitals Value Taken Time  BP    Temp    Pulse    Resp    SpO2      Last Pain:  Vitals:   06/16/22 0949  TempSrc: Temporal  PainSc: 0-No pain         Complications: No notable events documented.

## 2022-06-17 ENCOUNTER — Encounter: Payer: Self-pay | Admitting: Gastroenterology

## 2022-06-17 LAB — SURGICAL PATHOLOGY

## 2022-06-17 NOTE — Progress Notes (Signed)
Sore throat . I/s to gargle with warm salty. Understanding voiced

## 2022-06-22 ENCOUNTER — Other Ambulatory Visit: Payer: Self-pay | Admitting: Internal Medicine

## 2022-06-22 DIAGNOSIS — Z0001 Encounter for general adult medical examination with abnormal findings: Secondary | ICD-10-CM

## 2022-06-22 NOTE — Telephone Encounter (Signed)
Medication D/C 01/10/22.

## 2022-06-23 ENCOUNTER — Ambulatory Visit: Payer: No Typology Code available for payment source | Admitting: Psychology

## 2022-06-23 DIAGNOSIS — F89 Unspecified disorder of psychological development: Secondary | ICD-10-CM

## 2022-06-23 NOTE — Progress Notes (Signed)
Date: 06/23/2022   Appointment Start Time: 10am Duration: 55 minutes Provider: [name], PsyD Type of Session: Testing Appointment for Evaluation  Location of Patient: Home Location of Provider: Provider's Home (private office) Type of Contact: WebEx video visit with audio  Session Content: Today's appointment was a telepsychological visit due to COVID-19. Michele Dunlap is aware it is her responsibility to secure confidentiality on her end of the session. Prior to proceeding with today's appointment, Michele Dunlap's physical location at the time of this appointment was obtained as well a phone number she could be reached at in the event of technical difficulties. Michele Dunlap denied anyone else being present in the room or on the virtual appointment. This provider reviewed that video should not be captured, photos should not be taken, nor should testing stimuli be copied or recorded as it would be a copyright violation. Michele Dunlap expressed understanding of the aforementioned, and verbally consented to proceed. The WAIS-IV was administered, scored, and interpreted by this evaluator  Billing codes will be input on the feedback appointment. There are no billing codes for the testing appointment.   Provisional DSM-5 diagnosis(es):  F89 Unspecified Disorder of Psychological Development   Plan: Michele Dunlap was scheduled for a feedback appointment on 06/30/2022 at 10am via WebEx video visit with audio.                Dolores Lory, PsyD

## 2022-06-24 ENCOUNTER — Encounter: Payer: Self-pay | Admitting: Internal Medicine

## 2022-06-24 ENCOUNTER — Ambulatory Visit: Payer: No Typology Code available for payment source | Admitting: Internal Medicine

## 2022-06-24 ENCOUNTER — Ambulatory Visit (INDEPENDENT_AMBULATORY_CARE_PROVIDER_SITE_OTHER): Payer: No Typology Code available for payment source | Admitting: Internal Medicine

## 2022-06-24 VITALS — BP 118/72 | HR 82 | Temp 96.4°F | Wt 150.0 lb

## 2022-06-24 DIAGNOSIS — R300 Dysuria: Secondary | ICD-10-CM

## 2022-06-24 DIAGNOSIS — R8290 Unspecified abnormal findings in urine: Secondary | ICD-10-CM | POA: Diagnosis not present

## 2022-06-24 DIAGNOSIS — M545 Low back pain, unspecified: Secondary | ICD-10-CM | POA: Diagnosis not present

## 2022-06-24 DIAGNOSIS — R350 Frequency of micturition: Secondary | ICD-10-CM

## 2022-06-24 MED ORDER — NAPROXEN 375 MG PO TABS: 375.0000 mg | ORAL_TABLET | Freq: Two times a day (BID) | ORAL | 0 refills | Status: DC

## 2022-06-24 MED ORDER — METHOCARBAMOL 500 MG PO TABS: 500.0000 mg | ORAL_TABLET | Freq: Three times a day (TID) | ORAL | 0 refills | Status: DC | PRN

## 2022-06-24 NOTE — Telephone Encounter (Signed)
Copied from Mildred [phone]. Topic: General - Other >> Jun 24, 2022  1:19 PM Ja-Kwan M wrote: Reason for CRM: Pt requests a doctor's note to excuse her from work through Monday 06/27/22. Cb# [phone]

## 2022-06-24 NOTE — Patient Instructions (Signed)

## 2022-06-24 NOTE — Progress Notes (Signed)
HPI  Pt presents to the clinic today with c/o urinary frequency, dysuria, cloudy urine and back pain.  This started started about 2 weeks ago but has worsened in the last week.  She denies urinary urgency, blood in her urine, vaginal itching, vaginal irritation, vaginal discharge, abnormal vaginal bleeding.  She denies fever, chills, nausea or vomiting.  She denies any injury to her back that she is aware of.  She describes the pain as sore and achy but is worse with movement.  She has not tried anything OTC for this.  She has had a kidney infection in the past and reports this feels the same.    Review of Systems  Past Medical History:  Diagnosis Date   Allergy    Complication of anesthesia    Started to wake during EGD (age 89)   Migraine    couple times per week    Family History  Problem Relation Age of Onset   Hypertension Mother    Healthy Father    Down syndrome Sister    Hypertension Maternal Grandmother    Hypertension Maternal Grandfather    Hypertension Paternal Grandmother    Hypertension Paternal Grandfather     Social History   Socioeconomic History   Marital status: Single    Spouse name: Not on file   Number of children: Not on file   Years of education: Not on file   Highest education level: Not on file  Occupational History   Not on file  Tobacco Use   Smoking status: Never   Smokeless tobacco: Never  Vaping Use   Vaping Use: Never used  Substance and Sexual Activity   Alcohol use: Never   Drug use: Never   Sexual activity: Yes  Other Topics Concern   Not on file  Social History Narrative   Quorra is currently in 11th grade at [employer]. She lives at home with mother and sister. She has 3 dogs. She likes to dance.   Social Determinants of Health   Financial Resource Strain: Not on file  Food Insecurity: Not on file  Transportation Needs: Not on file  Physical Activity: Not on file  Stress: Not on file  Social Connections: Not on file   Intimate Partner Violence: Not on file    Allergies  Allergen Reactions   Multihance [Gadobenate] Nausea And Vomiting     Constitutional: Denies fever, malaise, fatigue, headache or abrupt weight changes.   GU: Pt reports cloudy urine, urine odor, frequency, pain with urination, low back pain. Denies burning sensation, blood in urine, odor or discharge. Skin: Denies redness, rashes, lesions or ulcercations.   No other specific complaints in a complete review of systems (except as listed in HPI above).    Objective:   Physical Exam  BP 118/72 (BP Location: Right Arm, Patient Position: Sitting, Cuff Size: Normal)   Pulse 82   Temp (!) 96.4 F (35.8 C) (Temporal)   Wt 150 lb (68 kg)   SpO2 98%   BMI 30.30 kg/m   Wt Readings from Last 3 Encounters:  06/16/22 148 lb 9.6 oz (67.4 kg)  06/02/22 148 lb (67.1 kg)  04/29/22 145 lb (65.8 kg)    General: Appears her stated age, obese, in NAD. Cardiovascular: Normal rate and rhythm. S1,S2 noted.   Pulmonary/Chest: Normal effort and positive vesicular breath sounds. No respiratory distress. No wheezes, rales or ronchi noted.  Abdomen: Soft and nontender. Normal bowel sounds. No distention or masses noted. No CVA tenderness.  Pain with flexion, extension, rotation of the spine.  No bony tenderness noted over the spine.  Pain with palpation of bilateral paralumbar muscles.  No difficulty with gait.        Assessment & Plan:   Urine Odor, Cloudy Urine, Frequency, Dysuria  Urinalysis: Normal Will send urine culture Drink plenty of fluids  Low Back Pain:  Seems muscular in nature Rx for Naproxen 375 mg twice daily x 5 days-consume with food Rx for Methocarbamol 500 mg every 8 hours as needed Encouraged heat and stretching  RTC in 6 months for her annual exam [name], NP

## 2022-06-25 LAB — URINE CULTURE
MICRO NUMBER:: 14638965
Result:: NO GROWTH
SPECIMEN QUALITY:: ADEQUATE

## 2022-06-28 ENCOUNTER — Ambulatory Visit: Payer: Self-pay | Admitting: *Deleted

## 2022-06-28 NOTE — Telephone Encounter (Signed)
  Chief Complaint: Dysuria, back pain Symptoms: Seen 06/24/22 for similar symptoms. States lower back pain has worsened, has frequency and urgency, dysuria. Naproxen and robaxin ineffective.  Frequency: Seen 06/24/22 UCX negative growth Pertinent Negatives: Patient denies fever Disposition: '[]'$ ED /'[]'$ Urgent Care (no appt availability in office) / '[x]'$ Appointment(In office/virtual)/ '[]'$  [facility]/ '[]'$ Home Care/ '[]'$ Refused Recommended Disposition /'[]'$ [facility]/ '[]'$  Follow-up with PCP Additional Notes: Pt states "Feels like when I had a kidney stone." Secured appt for tomorrow AM. Care advise provided, advised ED for worsening symptoms. Pt verbalizes understanding.  Reason for Disposition  Side (flank) or lower back pain present  Answer Assessment - Initial Assessment Questions 1. ONSET: "When did the pain begin?"      Seen 06/24/22 2. LOCATION: "Where does it hurt?" (upper, mid or lower back)     Lower back 3. SEVERITY: "How bad is the pain?"  (e.g., Scale 1-10; mild, moderate, or severe)   - MILD (1-3): Doesn't interfere with normal activities.    - MODERATE (4-7): Interferes with normal activities or awakens from sleep.    - SEVERE (8-10): Excruciating pain, unable to do any normal activities.      Stabbing in lower back 4. PATTERN: "Is the pain constant?" (e.g., yes, no; constant, intermittent)      Comes and goes, 7/10 5. RADIATION: "Does the pain shoot into your legs or somewhere else?"     No 6. CAUSE:  "What do you think is causing the back pain?"      Unsure 7. BACK OVERUSE:  "Any recent lifting of heavy objects, strenuous work or exercise?"     no 8. MEDICINES: "What have you taken so far for the pain?" (e.g., nothing, acetaminophen, NSAIDS)     Naproxen and methocarbamol 9. NEUROLOGIC SYMPTOMS: "Do you have any weakness, numbness, or problems with bowel/bladder control?"     no 10. OTHER SYMPTOMS: "Do you have any other symptoms?" (e.g., fever, abdomen pain,  burning with urination, blood in urine)       Dysuria, abdominal cramping  Answer Assessment - Initial Assessment Questions 1. SYMPTOM: "What's the main symptom you're concerned about?" (e.g., frequency, incontinence)     Dysuria 2. ONSET: "When did the  *No Answer*  start?"     Seen 06/24/23, negative 3. PAIN: "Is there any pain?" If Yes, ask: "How bad is it?" (Scale: 1-10; mild, moderate, severe)     7/10 back 4. CAUSE: "What do you think is causing the symptoms?"     "Kidney stone" 5. OTHER SYMPTOMS: "Do you have any other symptoms?" (e.g., blood in urine, fever, flank pain, pain with urination)     Dysuria, frequency, urgency  Protocols used: Back Pain-A-AH, Urinary Symptoms-A-AH

## 2022-06-29 ENCOUNTER — Encounter: Payer: Self-pay | Admitting: Internal Medicine

## 2022-06-29 ENCOUNTER — Other Ambulatory Visit (HOSPITAL_COMMUNITY)
Admission: RE | Admit: 2022-06-29 | Discharge: 2022-06-29 | Disposition: A | Discharge status: Home / Self Care | Payer: No Typology Code available for payment source | Source: Ambulatory Visit | Attending: Internal Medicine | Admitting: Internal Medicine

## 2022-06-29 ENCOUNTER — Ambulatory Visit (INDEPENDENT_AMBULATORY_CARE_PROVIDER_SITE_OTHER): Payer: No Typology Code available for payment source | Admitting: Internal Medicine

## 2022-06-29 VITALS — BP 118/80 | HR 88 | Temp 96.9°F | Wt 150.0 lb

## 2022-06-29 DIAGNOSIS — R300 Dysuria: Secondary | ICD-10-CM | POA: Diagnosis present

## 2022-06-29 DIAGNOSIS — R3989 Other symptoms and signs involving the genitourinary system: Secondary | ICD-10-CM

## 2022-06-29 DIAGNOSIS — M545 Low back pain, unspecified: Secondary | ICD-10-CM | POA: Diagnosis not present

## 2022-06-29 LAB — POCT URINALYSIS DIPSTICK
Bilirubin, UA: NEGATIVE
Bilirubin, UA: NEGATIVE
Blood, UA: NEGATIVE
Blood, UA: NEGATIVE
Glucose, UA: NEGATIVE
Glucose, UA: NEGATIVE
Ketones, UA: NEGATIVE
Ketones, UA: NEGATIVE
Leukocytes, UA: NEGATIVE
Leukocytes, UA: NEGATIVE
Nitrite, UA: NEGATIVE
Nitrite, UA: NEGATIVE
Protein, UA: NEGATIVE
Protein, UA: NEGATIVE
Spec Grav, UA: 1.01 (ref 1.010–1.025)
Spec Grav, UA: 1.025 (ref 1.010–1.025)
Urobilinogen, UA: 0.2 E.U./dL
Urobilinogen, UA: 0.2 E.U./dL
pH, UA: 5.5 (ref 5.0–8.0)
pH, UA: 5 (ref 5.0–8.0)

## 2022-06-29 MED ORDER — PHENAZOPYRIDINE HCL 200 MG PO TABS: 200.0000 mg | ORAL_TABLET | Freq: Three times a day (TID) | ORAL | 0 refills | Status: DC | PRN

## 2022-06-29 NOTE — Progress Notes (Unsigned)
HPI  Pt presents to the clinic today with c/o painful urination and low back pain.  This started 2.5 weeks ago. The dysuria occurs at the end of urination. It is intermittent.  She reports the back pain is most intense right before she has the need to urinate.  She denies urgency, frequency, blood in her urine, vaginal discharge, odor, lesions, or abnormal vaginal bleeding. She is having normal periods, LMP was 2 weeks ago. She was seen 3/1 for the same.  Urinalysis was normal at that time.  Urine culture did not grow any bacteria.  She was diagnosed with musculoskeletal back pain and treated with Naproxen and Methocarbamol but she reports that has not helped.  She has no history of kidney stones.   Review of Systems  Past Medical History:  Diagnosis Date   Allergy    Complication of anesthesia    Started to wake during EGD (age 4)   Migraine    couple times per week    Family History  Problem Relation Age of Onset   Hypertension Mother    Healthy Father    Down syndrome Sister    Hypertension Maternal Grandmother    Hypertension Maternal Grandfather    Hypertension Paternal Grandmother    Hypertension Paternal Grandfather     Social History   Socioeconomic History   Marital status: Single    Spouse name: Not on file   Number of children: Not on file   Years of education: Not on file   Highest education level: Not on file  Occupational History   Not on file  Tobacco Use   Smoking status: Never   Smokeless tobacco: Never  Vaping Use   Vaping Use: Never used  Substance and Sexual Activity   Alcohol use: Never   Drug use: Never   Sexual activity: Yes  Other Topics Concern   Not on file  Social History Narrative   Michele Dunlap is currently in 11th grade at [employer]. She lives at home with mother and sister. She has 3 dogs. She likes to dance.   Social Determinants of Health   Financial Resource Strain: Not on file  Food Insecurity: Not on file  Transportation Needs:  Not on file  Physical Activity: Not on file  Stress: Not on file  Social Connections: Not on file  Intimate Partner Violence: Not on file    Allergies  Allergen Reactions   Multihance [Gadobenate] Nausea And Vomiting     Constitutional: Denies fever, malaise, fatigue, headache or abrupt weight changes.   GU: Pt reports pain with urination and low back pain. Denies urgency, frequency, burning sensation, blood in urine, odor or discharge. Skin: Denies redness, rashes, lesions or ulcercations.   No other specific complaints in a complete review of systems (except as listed in HPI above).    Objective:   Physical Exam  BP 118/80 (BP Location: Left Arm, Patient Position: Sitting, Cuff Size: Normal)   Pulse 88   Temp (!) 96.9 F (36.1 C) (Temporal)   Wt 150 lb (68 kg)   SpO2 100%   BMI 30.30 kg/m   Wt Readings from Last 3 Encounters:  06/24/22 150 lb (68 kg)  06/16/22 148 lb 9.6 oz (67.4 kg)  06/02/22 148 lb (67.1 kg)    General: Appears her stated age, obese, in NAD. Cardiovascular: Normal rate and rhythm. S1,S2 noted.   Pulmonary/Chest: Normal effort and positive vesicular breath sounds. No respiratory distress. No wheezes, rales or ronchi noted.  Abdomen:  Soft and mildly tender over the bladder. Normal bowel sounds. No distention or masses noted.  She has some discomfort with palpation of the CVA area but no severe reaction.        Assessment & Plan:   Low Back Pain, Bladder Pressure and Dysuria:  Urinalysis:normal Will obtain wet prep Rx for Pyridium 200 mg every 8 hours as needed Drink plenty of fluids Consider CT renal stone study if symptoms persist or worsen  RTC in 6 months for annual exam [name], NP

## 2022-06-29 NOTE — Patient Instructions (Signed)
Flank Pain, Adult ?Flank pain is pain in your side. The flank is the area on your side between your upper belly (abdomen) and your spine. The pain may occur over a short time (acute), or it may be long-term or come back often (chronic). It may be mild or very bad. Pain in this area can be caused by many different things. ?Follow these instructions at home: ? ?Drink enough fluid to keep your pee (urine) pale yellow. ?Rest as told by your doctor. ?Take over-the-counter and prescription medicines only as told by your doctor. ?Keep a journal to keep track of: ?What has caused your flank pain. ?What has made your flank pain feel better. ?Keep all follow-up visits. ?Contact a doctor if: ?Medicine does not help your pain. ?You have new symptoms. ?Your pain gets worse. ?Your symptoms last longer than 2-3 days. ?You have trouble peeing. ?You are peeing more often than normal. ?Get help right away if: ?You have trouble breathing. ?You are short of breath. ?Your belly hurts, or it is swollen or red. ?You feel like you may vomit (nauseous). ?You vomit. ?You feel faint, or you faint. ?You have blood in your pee. ?You have flank pain and a fever. ?These symptoms may be an emergency. Get help right away. Call your local emergency services (911 in the [place] ?Do not wait to see if the symptoms will go away. ?Do not drive yourself to the hospital. ?Summary ?Flank pain is pain in your side. The flank is the area of your side between your upper belly (abdomen) and your spine. ?Flank pain may occur over a short time (acute), or it may be long-term or come back often (chronic). It may be mild or very bad. ?Pain in this area can be caused by many different things. ?Contact your doctor if your symptoms get worse or last longer than 2-3 days. ?This information is not intended to replace advice given to you by your health care provider. Make sure you discuss any questions you have with your health care provider. ?Document Revised:  06/22/2020 Document Reviewed: 06/22/2020 ?Elsevier Patient Education ? 2023 Elsevier Inc. ? ?

## 2022-06-29 NOTE — Addendum Note (Signed)
Addended by: Jearld Fenton on: 06/29/2022 07:55 AM   Modules accepted: Orders

## 2022-06-30 ENCOUNTER — Encounter: Payer: Self-pay | Admitting: Gastroenterology

## 2022-06-30 ENCOUNTER — Encounter: Payer: Self-pay | Admitting: Internal Medicine

## 2022-06-30 ENCOUNTER — Ambulatory Visit: Payer: No Typology Code available for payment source | Admitting: Psychology

## 2022-06-30 DIAGNOSIS — F902 Attention-deficit hyperactivity disorder, combined type: Secondary | ICD-10-CM

## 2022-06-30 DIAGNOSIS — F4389 Other reactions to severe stress: Secondary | ICD-10-CM

## 2022-06-30 NOTE — Progress Notes (Signed)
Testing and Report Writing Information: The following measures  were administered, scored, and interpreted by this provider:  Generalized Anxiety Disorder-7 (GAD-7; 5 minutes), Patient Health Questionnaire-9 (PHQ-9; 5 minutes), Wechsler Adult Intelligence Scale-Fourth Edition (WAIS-IV; 70 minutes), CNS Vital Signs (45 minutes), Adult Attention Deficit/Hyperactivity Disorder Self-Report Scale Checklist (ASRSv1.1; 15 minutes), Behavior Rating Inventory for Executive Function - A - Self Report (BRIEF A; 10 minutes) and Behavior Rating Inventory for Executive Function - A - Informant (BRIEF-A; 10 minutes), PTSD Checklist for DSM-5 (PCL-5; 15 minutes), and Personality Assessment Inventory (PAI; 50 minutes). A total of 225 minutes was spent on the administration and scoring of the aforementioned measures. Codes [id number] and [phone] (6 units) were billed.  Please see the assessment for additional details. This provider completed the written report which includes integration of patient data, interpretation of standardized test results, interpretation of clinical data, review of information provided by Michele Dunlap and any collateral information/documentation, and clinical decision making (200 minutes in total).  Feedback Appointment: Date: 06/30/2022 Appointment Start Time: 10:03am Duration: 50 minutes Provider: [name], PsyD Type of Session: Feedback Appointment for Evaluation  Location of Patient: Home Location of Provider: Provider's Home (private office) Type of Contact: WebEx video visit with audio  Session Content: Today's appointment was a telepsychological visit due to COVID-19. Michele Dunlap is aware it is her responsibility to secure confidentiality on her end of the session. She provided verbal consent to proceed with today's appointment. Prior to proceeding with today's appointment, Michele Dunlap's physical location at the time of this appointment was obtained as well a phone number she could be reached at in the event  of technical difficulties. Michele Dunlap denied anyone else being present in the room or on the virtual appointment.  This provider and Michele Dunlap completed the interactive feedback session which includes reviewing the aforementioned measures, treatment recommendations, and diagnostic conclusions.   The interactive feedback session was completed today and a total of 50 minutes was spent on feedback. Code [phone] was billed for feedback session.   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  (billing code [phone] [3 units])  Plan: Michele Dunlap provided verbal consent for her evaluation to be sent via e-mail. No further follow-up planned by this provider.        CONFIDENTIAL PSYCHOLOGICAL EVALUATION ______________________________________________________________________________ Name: Michele Dunlap   Date of Birth: 2000/09/27    Age: 13 Dates of Evaluation: 06/15/2022, 06/21/2022, and 06/23/2022  SOURCE AND REASON FOR REFERRAL: Ms. Michele Dunlap was referred by NP [name] for an evaluation to ascertain if she meets criteria for Attention Deficit/Hyperactivity Disorder (ADHD).   EVALUATIVE PROCEDURES: Clinical Interview with Ms. Michele Dunlap (06/15/2022) Wechsler Adult Intelligence Scale-Fourth Edition (WAIS-IV; 06/23/2022) CNS Vital Signs (06/21/2022) Adult Attention Deficit/Hyperactivity Disorder Self-Report Scale Checklist (06/21/2022) Behavior Rating Inventory for Executive Function - A - Self Report Behavior Rating Inventory for Executive Function - A - Self Report (BRIEF-A; 06/21/2022) and Informant (06/21/2022) Personality Assessment Inventory (PAI; 06/21/2022) Patient Health  Questionnaire-9 (PHQ-9) Generalized Anxiety Disorder-7 (GAD-7) PTSD Checklist for DSM-5 (PCL-5; 06/21/2022) Adult OCD Inventory (OCD-A) SF-20 (06/21/2022)   BACKGROUND INFORMATION AND PRESENTING PROBLEM: Ms. Laren Michele Dunlap is a 22 year old female who resides in [place].  [name] reported an unspecified provider recommended she pursue an ADHD evaluation secondary to her endorsed "trouble focusing" and difficulty "  doing a whole lot of stuff in general." She described her ADHD-related concerns as having occurred since childhood and include "always" being easily distracted but it has become exacerbated recently; however, she noted uncertainty on why this has occurred but indicated it may be related to her potentially having multiple sclerosis and the difficulty of her current college classes. She also discussed challenges with regular task initiation (e.g., feeling "overwhelmed by everything that needs to be done" which contributes to trouble starting tasks) and maintenance (e.g., becoming distracted by other tasks or thoughts which often leads to her ceasing the initial task prior to completion) issues; trouble relaxing as she commonly feels she needs to be moving or doing something; regular forgetfulness of a variety of things, including details of an appointment, what she was doing or going to say, and where she placed items which can cause irritability; regular making of mistakes or missing details as well as being prone to starting tasks without fully understanding the directions, which can cause her spending considerable time and energy "attempting to solve" a task before realizing she was missing a detail or she had not input needed information; trouble sustaining her attention while others are talking which has led to some people making comments to her about it despite her efforts to sustain her attention; disorganization that causes clutter at her home and in her car; trouble sticking to plans  and commitments as she often forgets them or experiences decreased motivation to follow through with them once the time comes; habitual fidgeting or squirming (e.g., playing with various items, "messing" with her dogs hair, looking around a room, and cracking her hands) and feeling restless when she has to stay seated; commonly having urges to interrupt others as she worries she will forget what she wants to say and/or she will continue to think about what she wants to say which negatively impacts her ability to listen; tending to engage in excessive talking as she tends to "chain" stories together which leads to her "talking for a really long time" despite a desire to hear from others; experiencing agitation if required to wait for something she "want[s] to keep [her] focus on" which she attributed to worries she will forget what she wants to do (e.g., waiting her turn during a board game); problems following proper order or sequence of tasks (e.g., recipes and instructions for classes with labs) as she tries to find the "quickest way" to finish a task, which can contribute to mistakes being made; and becoming overwhelmed when there is a lot of stimuli which she attributed to her brain not filtering out irrelevant stimuli. Ms. Ensing also shared she was previously diagnosed with OCD which she attributed to compulsive behaviors (e.g., "needing" to lock her car three times and to see the lights on her car flash) and belief that something bad would happen if she did not engage in the compulsion, although noted use of various coping strategies have reduced the frequency and duration of her checking behaviors; anxiety; and hypervigilance about safety concerns and trouble sleeping if no one else is awake, which she attributed to her mother's fianc having died by suicide while her and her family were sleeping and a belief she "could have saved him" if she was awake. She expressed a belief her ADHD-related concerns are  consistent and independent of mood but can be exacerbated by stress. She stated her coping strategies include taking a picture of something she may later worry she had not done (e.g., her  door having been locked), utilizing visual reminders and a planner, and putting a limit on the number of times she can "check" something.   Ms. Speziale denied awareness of ever experiencing developmental milestone delays, grade repetition, or learning disability diagnosis. She discussed throughout schooling she was in "advanced classes," received primarily "As" with occasional "Bs," and did not experience disciplinary actions for behavioral concerns; however, she stated she found it harder to sustain her attention in class and was prone to putting off tasks which she attributed to the reduced structure and increased requirement of completing assignments outside of school as she found the school setting to be less distracting. She shared in college she started receiving "Cs," and expressed a belief she generally understands the class material but is struggling with the reduced structure making it easier to become distracted by things outside of her class requirements. Ms. Fluet stated she is currently employed as a Education administrator which she noted is "stressful." She denied a history of employment disciplinary actions.   Ms. Kolesnikov reported her medical history is significant for a potential "neurological condition." She denied a history of seizures or head injury. She reported past use of mental health services after her mother's fianc died and history of a psychiatric hospitalization after having taken "more than [she] was supposed to" of an unspecified medication that she described as an effort to sleep due to sleeping difficulties at that time (versus a self-harm attempt). She also reported current use of an anxiolytic that she described as reducing her anxiety but not improving her ADHD-related concerns. Ms. Kiplinger  noted current use of a Celsius energy drink and an occasional 12oz soda, describing caffeine as helpful for fatigue and inattention. She denied use of all other recreational and illicit substances. She noted a history of engagement in overeating- and emotional eating-related behaviors (e.g., "eat[ing] all the time" and until feeling physically sick when younger and eating more than needed for "comfort" or because she had skipped breakfast). She also denied ever meeting full criteria for a depressive episode; hypomanic or manic episode; trauma- and stressor-related disorder; and suicidal or homicidal ideation, plan, or intent. Ms. Carawan noted her familial mental health history is significant for depression (mother and father), anxiety (mother), posttraumatic stress disorder (PTSD; mother), unspecified problematic substance use (father), and ADHD (mother and father).   Chart Review: The appointment note for date of services 11/25/2021 with NP [name] indicated "[Ms. Pangelinan] reports inattention. Denies dizziness, difficulty with memory, difficulty with speech or problems with balance and coordination." She also reported Ms. Parron has a "history of anxiety and depression" as well as "compulsive behavior."  BEHAVIORAL OBSERVATIONS: Ms. Pecina presented on time for the evaluation. She was well-groomed. She was oriented to time, place, person, and purpose of the appointment. During the interview, Ms. Huett occasionally lost her train of thought while answering a question. During the evaluation Ms. Makki verbalized and/or demonstrated doubt (e.g., stating answers with a questioning tone) as well as working Marine scientist- (e.g., saying she "forgot" or "can't remember" the remaining digits and difficulty retaining information from lengthier verbally presented arithmetic questions) and self-expression trouble (e.g., noting she "[does not] know how to explain [the relationship between two words]" and "That is not the  word I am looking for" after providing an answer). Throughout the course of the evaluation, she maintained appropriate eye contact. Her thought processes and content were logical, coherent, and goal-directed. There were no overt signs of a thought disorder or perceptual disturbances, nor did  she report such symptomatology. There no evidence of paraphasias (i.e., errors in speech, gross mispronunciations, and word substitutions), repetition deficits, or disturbances in volume or prosody (i.e., rhythm and intonation). Overall, based on Ms. Rosetti's approach to testing, the current results are believed to be a good estimate of her abilities.  PROCEDURAL CONSIDERATIONS:  Psychological testing measures were conducted through a virtual visit with video and audio capabilities, but otherwise in a standard manner.   The Wechsler Adult Intelligence Scale, Fourth Edition (WAIS-IV) was administered via remote telepractice using digital stimulus materials on [employer]'s Q-global system. The remote testing environment appeared free of distractions, adequate rapport was established with the examinee via video/audio capabilities, and Ms. Oberhelman appeared appropriately engaged in the task throughout the session. No significant technological problems or distractions were noted during administration. Modifications to the standardization procedure included: none. The WAIS-IV subtests, or similar tasks, have received initial validation in several samples for remote telepractice and digital format administration, and the results are considered a valid description of Ms. Forbis's skills and abilities.  CLINICAL FINDINGS:  COGNITIVE FUNCTIONING  Wechsler Adult Intelligence Scale, Fourth Edition (WAIS-IV):  Ms. Dabel completed subtests of the WAIS-IV, a full-scale measure of cognitive ability. She completed subtests of the WAIS-IV, a full-scale measure of cognitive ability. The WAIS-IV is comprised of four indices that measure  cognitive processes that are components of intellectual ability; however, only subtests from the Verbal Comprehension and Working Memory indices were administered. As a result, Full-Scale-IQ (FSIQ) and General Ability Index (GAI) were unable to be determined.   WAIS-IV Scale/Subtest IQ/Scaled Score 95% Confidence Interval Percentile Rank Qualitative Description  Verbal Comprehension (VCI) 100 94-106 50 Average  Similarities 10     Vocabulary 12     Information 8     Working Memory (WMI) 92 86-99 30 Average  Digit Span 9     Arithmetic 8       The Verbal Comprehension Index (VCI) provides a measure of one's ability to receive, comprehend, and express language. It also measures the ability to retrieve previously learned information and to understand relationships between words and concepts presented orally. Ms. Weathersby obtained a VCI scaled score of 100 (50th percentile) placing her in the average range compared to same-aged peers. Her performance on the subtests comprising this index was diverse. Out of the three subtests, Ms. Woodfin demonstrated the strongest performance on the Vocabulary subtest, which required her to explain the meaning of words presented in isolation. Additionally, performance on this subtest requires abilities to verbalize meaningful concepts, as well as retrieve information from long-term memory. Her lowest performance was on the Information subtest which is primarily a measure of her fund of general knowledge but may also be influenced by cultural experience, quality of education, and ability to retrieve information from long-term memory.    The Working Memory Index ([place]) provides a measure of one's ability to sustain attention, concentrate, and exert mental control. Ms. Whelan obtained a WMI scaled score of 92 (30th percentile), placing her in the average range compared to same-aged peers. Ms. Saidi demonstrated similar performance on the subtests comprising this  index.  ATTENTION AND PROCESSING  CNS Vital Signs: The CNS Vital Signs assessment evaluates the neurocognitive status of an individual and covers a range of mental processes. The results of the CNS Vital Signs testing indicated low average neurocognitive processing ability. Regarding attention, simple attention and complex attention were very low and sustained attention was low average. Executive function and cognitive flexibility were low range.  Working memory was low. Psychomotor speed and motor speed were low average. Processing speed and reaction time were average, which indicates average thinking speed and responsiveness. Visual memory (images) was very low and verbal memory (words) was average, which indicates verbal memory is a relative strength and visual strength is impaired. The results suggest Mr. Rozenberg experiences simple attention, complex attention, working memory, and visual memory impairment as well as psychomotor speed, cognitive flexibility, executive function, sustained attention, and motor speed weakness; however, working memory and sustained attention were deemed potentially invalid. Upon follow-up, Ms. Tep expressed difficulty understanding the instructions for part four of the CPT which likely at least partly explains her potentially invalid working memory and sustained attention performance.   Domain  Standard Score Percentile Validity Indicator Guideline  Neurocognitive Index 81 10 Yes Low Average  Composite Memory 76 5 Yes Low  Verbal Memory 97 42 Yes Average  Visual Memory 68 2 Yes Very Low  Psychomotor Speed 88 21 Yes Low Average  Reaction Time 95 37 Yes Average  Complex Attention 62 1 Yes Very Low  Cognitive Flexibility 83 13 Yes Low Average  Processing Speed  91 27 Yes Average  Executive Function 87 19 Yes Low Average  Working Memory 74 4 No Low  Sustained Attention 81 10 No Low Average  Simple Attention 26 1 Yes Very Low  Motor Speed 87 19 Yes Low Average    EXECUTIVE FUNCTION  Behavior Rating Inventory of Executive Function, Second Edition (BRIEF-A) Self-Report:  Ms. Blakney completed the Self-Report Form of the Behavior Rating Inventory of Executive Function-Adult Version (BRIEF-A), which has three domains that evaluate cognitive, behavioral, and emotional regulation, and a Global Executive Composite score provides an overall snapshot of executive functioning. There are no missing item responses in the protocol. The Negativity, Infrequency, and Inconsistency scales are not elevated, suggesting she did not respond to the protocol in an overly negative, haphazard, extreme, or inconsistent manner. In the context of these validity considerations, ratings of Ms. Bogue's everyday executive function suggest some areas of concern. The overall index, the Global Executive Composite (GEC), was elevated (GEC T = 78, %ile = >99). Both the Behavioral Regulation (BRI) and the Metacognition (MI) Indexes were elevated (BRI T = 68, %ile = 93 and MI T = 82, %ile = >99). Ms. Heifetz indicated difficultly with her ability to inhibit impulsive responses, adjust to changes in routine or task demands, initiate problem solving or activity, sustain working memory, plan and organize problem-solving approaches, attend to task-oriented output, and organize environment and materials. She did not describe her ability to modulate emotions and monitor social behavior as problematic, although modulate emotions approached an abnormal elevation. Ms. Dejoseph elevated scores on the Inhibit scale as well as the Behavioral Regulation and the Metacognition Indexes, suggests she has poor inhibitory control and/or suggest more global behavioral dysregulation is having a negative effect on active metacognitive problem solving.  Scale/Index  Raw Score T Score Percentile  Inhibit 17 67 95  Shift 14 73 98  Emotional Control 21 63 88  Self-Monitor 11 59 83  Behavioral Regulation Index (BRI) 63 68  93  Initiate 23 85 >99  Working Memory 21 83 >99  Plan/Organize 23 73 98  Task Monitor 15 77 >99  Organization of Materials 20 69 97  Metacognition Index (MI) 102 82 >99  Global Executive Composite (GEC) 165 78 >99   Validity Scale Raw Score Cumulative Percentile Protocol Classification  Negativity 0 0 - 98.3 Acceptable  Infrequency 0  0 - 97.3 Acceptable  Inconsistency 4 0 - 99.2 Acceptable   Behavior Rating Inventory of Executive Function, Second Edition (BRIEF-A) Informant: Ms. Grainer parent, Ms. Oleta Mouse, completed the Informant Form of the Behavior Rating Inventory of Executive Function-Adult Version (BRIEF-A), which is equivalent to the Self-Report version and has three domains that evaluate cognitive, behavioral, and emotional regulation, and a Global Executive Composite score provides an overall snapshot of executive functioning. There are no missing item responses in the protocol. The Negativity, Infrequency, and Inconsistency scales are not elevated, suggesting she did not respond to the protocol in an overly negative, haphazard, extreme, or inconsistent manner. In the context of these validity considerations, Ms. Angela Turley's ratings of Ms. Maudell Yandow's everyday executive function suggest some areas of concern. The overall index, the Global Executive Composite (GEC), was elevated (GEC T = 72, %ile = 97). Both the Behavioral Regulation (BRI) and the Metacognition (MI) Indexes were elevated (BRI T = 72, %ile = 98 and MI T = 71, %ile = 97). Ms. Cortney Mccurley indicated Ms. Michele Dunlap experiences difficultly with her ability to inhibit impulsive responses, adjust to changes in routine or task demands, modulate emotions, monitor social behavior, initiate problem solving or activity, sustain working memory, plan and organize problem-solving approaches, and attend to task-oriented output. Ms. Shalandra Yetman did not describe Ms. Monda Lanza's ability to organize environment and  materials as problematic. This profile suggests significant problem-solving rigidity combined with emotional dysregulation. Individuals with this profile tend to lose emotional control when their routines or perspectives are challenged and/or flexibility is required. Additionally, the elevated scores on the Inhibit scale as well as the Behavioral Regulation and the Metacognition Indexes, suggest Ms. Sri Bodiford is perceived as having poor inhibitory control and/or suggest more global behavioral dysregulation is having a negative effect on active metacognitive problem solving.  Scale/Index  Raw Score T Score Percentile  Inhibit 17 66 91  Shift 14 69 97  Emotional Control 29 75 >99  Self-Monitor 14 65 91  Behavioral Regulation Index (BRI) 74 72 98  Initiate 21 74 99  Working Memory 21 80 99  Plan/Organize 23 67 93  Task Monitor 14 69 96  Organization of Materials 16 56 72  Metacognition Index (MI) 95 71 97  Global Executive Composite (GEC) 169 72 97   Validity Scale Raw Score Cumulative Percentile Protocol Classification  Negativity 3 0 - 98.5 Acceptable  Infrequency 0 0 - 93.3 Acceptable  Inconsistency 2 0 - 98.8 Acceptable   BEHAVIORAL FUNCTIONING   Patient Health Questionnaire-9 (PHQ-9): Ms. Howorth completed the PHQ-9, a self-report measure that assesses symptoms of depression. She scored 22/27, which indicates severe depression. She indicated experiencing thoughts she would be better off dead or of hurting herself in some way several days over the two weeks prior to completing the measure.   Generalized Anxiety Disorder-7 (GAD-7): Ms. Witherow completed the GAD-7, a self-report measure that assesses symptoms of anxiety. She scored 20/21, which indicates severe anxiety.   Adult ADHD Self-Report Scale Symptom Checklist (ASRS): Ms. Mccleese reported the following symptoms as sometimes: leaving her seat when expected to stay seated, difficulty waiting for turn in turn taking situations, and  interrupting others when they are busy. She endorsed the following symptoms as occurring often: difficulty wrapping up final details of a project following the completion of challenging aspects, making careless mistakes when working on boring or difficult projects, misplacing or has difficulty finding things, talking too much in social situations, and interrupting others  or finishing their sentences. She endorsed the following symptoms as very often: difficulty getting things in order when a task requires organization, problems remembering appointments or obligations, avoiding or delaying getting started on tasks requiring a lot of thought, fidgeting or squirming, feeling overly active and compelled to do things, struggling to sustain attention when doing boring or repetitive work, struggling to concentrate on what people say even when they are speaking directly to her, being distracted by noise around her, feeling restless or fidgety, and difficulty relaxing. Endorsement of at least four items in Part A is highly consistent with ADHD in adults. The frequency scores of Part B provides additional cues. Ms. Balling scored a 6/6 on Part A and 11/12 on Part B, which is considered a positive screening for ADHD.   Adult OCD Inventory (OCD-A) SF-20: The OCD-A SF-20 was administered. Ms. Leddon scored a 165/300, which is indicative of moderate problems with OCD-related concerns. She endorsed the following as "Yes, this stops me a little or wastes a little of my time": hating dirt or dirty things and having a special number that she counts up to or doing things just that number of times. She endorsed the following as "Yes, this stops me from doing other things or wastes some of my time": spending more time than she needs to check her work, having a special number, worrying about being clean, arranging things in certain ways or symmetrically, getting angry if other people mess up her desk or work area, having to check  things several times, feeling guilty over minor infractions, and finding herself counting things or having number frequently go through her mind. She endorsed the following as "Yes, this stops me from doing a lot of things and wastes a lot of my time": having trouble making up her mind, when putting things away having to put them away just right, eating the same foods, having to do things over a certain number of times before it's just right, and often feeling compelled to do things that she doesn't really want to do.  PTSD Checklist for DSM-5 (PCL-5): The PCL-5 was administered. Ms. Dubose scored 38/80. She endorsed repeated, disturbing, and unwanted memories of the stressful experience (a little bit); repeated, disturbing dreams of the stressful experience (a little bit); flashbacks (a little bit); feeling very upset when something reminds you of the stressful experience (moderately); having strong physical reactions when something reminds you of the stressful experience (a little bit); avoiding memories, thoughts, or feelings related to the stressful experience (moderately); avoiding external reminders of the stressful experience (quite a bit); trouble remembering important parts of the stressful experience (quite a bit); having strong negative beliefs about yourself, other people, or the world (a little bit); blaming yourself or someone else for the stressful experience or what happened after it (a little bit); having strong negative feelings such as fear, horror, anger, guilt, or shame (a little bit); loss of interest in activities you used to enjoy (a little bit); feeling distant or cut off from other people (moderately); trouble experiencing positive feelings (a little bit); irritable behavior, angry outbursts, or acting aggressively (a little bit); taking too many risks or doing things that could cause you harm (not at all); being superalert or watchful or on guard (extremely); feeling jumpy or easily  startled (extremely); having difficulty concentrating (extremely); and trouble falling or staying asleep (extremely).   Personality Assessment Inventory (PAI): The PAI is an objective inventory of adult personality. The validity indicators suggested Ms. Chana Bode  responded consistently to similar item content (ICN T = 49), attended appropriately to item content (INF T = 59), and did not appear to portray herself in an unrealistically positive manner (PIM T = 31); however, her responses suggest an element of exaggeration of complaints and problems (NIM T = 77). Given this validity concern, this evaluator interpreted her results at a fairly superficial level as there is a possibility clinical scale elevations may overrepresent the extent and degree of test findings. She is endorsing a ruminative preoccupation with physical functioning and health matters and severe impairment arising from somatic symptoms (SOM T = 94); impairment associated with multiple anxiety- and/or trauma- and stressor-related disorders such that her life is seriously constricted and she feels chronically overwhelmed (ANX T = 97, ARD-O T = 62, ARD-P T = 87, BOR-A T = 72, ARD-T T = 84, BOR-I T = 74, and STR = 86); prominent unhappiness and dysphoria (DEP T = 79) and recurrent thoughts related to suicide (SUI T = 70); an activity level that is noticeably high even to casual observers (MAN-A T = 70); and having experienced various relational issues (PAR-R T = 66, NON T = 78, and BOR-N T = 72) that may at least partially stem from a loosening of associations and self-expression difficulties (SCZ-T T = 90) and contribute to little desire for close emotional relationships and social isolation or detachment potentially serving to decrease the discomfort that interpersonal contact fosters (PAR-H T = 60, SCZ-S T = 71, and NON T = 78). She appears to acknowledge significant difficulties in functioning and the perception that help is needed in dealing with  these problems (RXR T = 31).    SUMMARY AND CLINICAL IMPRESSIONS: Ms. Alexy Hoofnagle is a 22 year old female who was referred by NP [name] for an evaluation to determine if she currently meets criteria for a diagnosis of Attention-Deficit/Hyperactivity Disorder (ADHD).   Ms. Linnebur reported it was recommended she pursue an ADHD evaluation by one of her providers secondary to her endorsing "trouble focusing" and difficulty "doing a whole lot of stuff in general." She also reported she was previously diagnosed with OCD; anxiety; and hypervigilance that she attributed to a past traumatic event. She expressed a belief her ADHD-related concerns are consistent and independent of mood but can be exacerbated by stress, adding that despite use of an anxiolytic that reduced anxiety symptomatology she continues to experience ADHD-related concerns.   During the evaluation, Ms. Pettee was administered assessments to measure her current cognitive abilities. Her verbal comprehension abilities were in the average range, and strongest performance on the Vocabulary subtest, which required her to explain the meaning of words presented in isolation. Additionally, performance on this subtest requires abilities to verbalize meaningful concepts, as well as retrieve information from long-term memory. Her lowest performance was on the Information subtest which is primarily a measure of her fund of general knowledge but may also be influenced by cultural experience, quality of education, and ability to retrieve information from long-term memory. Her ability to sustain attention, concentrate, and exert mental control was also in the average range, although eight points lower than her VCI score, and her performance was similar on the subtests that comprise the WMI index. Results of the CNS Vital Signs indicated low average neurocognitive processing ability. Her results suggest she experiences simple attention, complex attention,  working memory, and visual memory impairment as well as psychomotor speed, cognitive flexibility, executive function, sustained attention, and motor speed weakness; however, working memory and  sustained attention were deemed potentially invalid. Ms. Savasta expressed difficulty understanding the instructions for part four of the CPT which likely at least partly explains her potentially invalid working memory and sustained attention performance.  During the clinical interview and on self-report measures, Ms. Schreck endorsed significant executive functioning concerns that include attentional dysregulation and hyperactivity- and impulsivity-related symptoms as well as indicated she meets full criteria for ADHD. Moreover, her mother, Ms. Oleta Mouse, indicated Ms. Chana Bode experiences a variety of executive functioning issues. While potentially invalid test results make interpretation difficult, when considering self-reported symptoms; endorsed and/or demonstrated impairment on measures of attention, executive functioning, cognitive flexibility, visual memory, motor speed, and working memory; and an unspecified provider reportedly expressing concerns she may meet criteria for a diagnosis of ADHD; and a reported familial history of ADHD, a diagnosis of F90.2 Attention-Deficit/Hyperactivity Disorder, Combined Presentation, Moderate appears warranted. The specifier of "Moderate" was assigned as she endorsed symptoms in excess of what is needed to make the diagnosis and noted they cause impairment in academic (e.g., having difficulty sustaining attention during class and completing assignments), social (e.g., being prone to engage in excessive talking as well as having trouble sustaining her attention while others are talking to her and experiencing urges to interrupt), and daily (e.g., regular forgetfulness, task initiation and completion issues, and being easily distracted) functioning.  Ms. Acheampong also endorsed  she was previously diagnosed with OCD that she attributed to compulsions (e.g., "needing" to lock her car three times and to see the lights on her car flash) and beliefs something bad would happen if she did not engage in the compulsion, although added use of various coping strategies have reduced the frequency and duration of her checking behaviors; anxiety; and hypervigilance about safety concerns and trouble sleeping if no one else is awake. As such, the PHQ-9, GAD-7, OCD-A, PCL-5, and PAI were administered. Her results suggested she is experiencing severe depression- and anxiety-related symptomatology, moderate OCD-related problems, and multiple trauma- and stressor-related disorder symptomatology. While Ms. Gottshall is endorsing multiple PTSD-related symptoms, she does not appear to meet full criteria for the disorder. 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 While chart review and Ms. Wanner indicate a history of anxiety disorder, depressive disorder, and OCD, given the limited scope of this evaluation, it was unable to be determined if full criteria for the aforementioned disorder(s) is met of if her symptoms are better explained by diagnoses of ADHD and Other Specified Trauma- and Stressor-Related Disorder. As such, Ms. Prestage would likely benefit from further evaluation of these symptoms to definitively rule in or out an anxiety disorder, depressive disorder, and OCD. Should any of the aforementioned be ruled in, they would likely be in addition to her diagnosis of ADHD as she indicated her ADHD-related concerns are consistent and independent of mood, trauma reminders or hypervigilance, and compulsions.  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  Stearnes would likely benefit from making use of strategies for ADHD symptoms:  Setting a timer to complete tasks. Breaking tasks into manageable chunks and spreading them out over longer periods of time with breaks.  Utilizing lists and day calendars to keep track of tasks.  Answering emails daily.  Improving listening skills by asking the speaker to give information in smaller chunks and asking for explanation for clarification as needed. Leaving more than the anticipated time to complete tasks.  It may help to keep tasks brief, well within your attention span, and a mix of both high and low interest tasks. Tasks may be gradually increased in length. Practice proactive planning by setting aside time every evening to plan for the next day (e.g., prepare needed materials or pack the car the night before).  Learn how to make an effective and reasonable "to do" list of important tasks and priorities and always keep it easily accessible. Make additional copies in case it is lost or misplaced. Utilize visual reminders by posting appointments, "to do lists," or schedule in strategic areas at home and at work.  Practice using an appointment book, smart phone or other tech device, or a daily planning calendar, and learn to write down appointments and commitments immediately. Keep notepads or use a portable audio recorder to capture important ideas that would be beneficial to recall later. Learn and practice time management skills. Purchase a programmable alarm watch or set an alarm on smartphone to avoid losing track of time.  Use a color-coded file system, desk and closet organizers, storage boxes, or other organization devices to reduce clutter and improve efficiency and structure.  Implement ways to become more aware of your actions and to inhibit or adjust them as warranted (e.g., reviewing videos of your actions, consider consequences of obeying or not obeying the rules of various upcoming situations,  have a trusted other to discuss plans with and/or provide cues to stop certain behaviors, and make visual cues for rules you would like to follow). Stay flexible and be prepared to change your plans as symptom breakthroughs and crises are likely to occur periodically. Ms. Kaiser may benefit from mindfulness training to address symptoms of inattention.  Ms. Wice would likely benefit from a consultation regarding medication for ADHD symptoms.   Individual therapeutic services may assist in processing a diagnosis of ADHD and discussing coping and compensatory strategies. Mental alertness/energy can be raised by increasing exercise; improving sleep; eating a healthy diet; and managing endorsed mental health concerns and stress. Consulting with a physician regarding any changes to physical regimen is recommended. "Failing at Normal: An ADHD Success Story" by Mellody Drown is a great overview of ADHD. Dr. Murlean Hark also has a YouTube channel with helpful videos on ADHD-related topics: [id number] Applications:  RescueTime. Tracks your activities on phone and/or computer to determine how productive you have been, and what distracted you. Free two week trial.  Focus'@Will'$ . Uses engineered audio that human voice-like frequencies. Free 15-day trial. Freedom. Allows you to highlight days and times you want to block yourself from certain sites or apps. Free trial. Mint.  Allows you to input your bank accounts and creates a visual layout of various information about your financial goals, budget management, alerts, etc. Free. Boomerang. Gives you the option to schedule times an email is sent as well as to see if others have received or opened your email. 10 messages free per month and a free trial of premium version. IFTTT. Uses "channels" to create various actions (e.g., if you are mentioned in an email to highlight it in your inbox and if you miss a call to add  it to a to-do list). Free and premium versions. Unroll.me. Cleans up your email by unsubscribing from what you do not want to receive while still getting everything you do. Free. Finish. Allows you to divide two-list tasks into short-term, mid-term, and long-term as well as how much time is left for a task. Focus mode hides non-priority  tasks.  Autosilent. Turns your phone ringer on and off based on specified calendars, geo-fences, timers, etc. $3.99. Freakyalarm. Makes you solve math problems to disable an alarm. $1.99. Wake N Shake. Makes you vigorously shake your phone to stop the alarm. $.99. Todoist. Allows you to add sub-tasks to tasks as well as includes email and Web plugins to make it work across system. Premium has location-based reminders, calendar sync, productive tracking, etc.  Sleep Cycle. Utilizes your phone's motion sensors to pick up on movement while you are asleep. The alarm will wake you as early as 30 minutes before your alarm based on your lightest phase of sleep as well as showing you how daily activities affect your sleep quality.  Book: "Taking Charge of Adult ADHD Second Edition" by Dr. Murlean Hark Organizations that are a good source of information on ADHD:  Children and Adults with Attention-Deficit/Hyperactivity Disorder (CHADD): chadd.org  Attention Deficit Disorder Association (ADDA): CondoFactory.com.cy ADD Resources: addresources.org ADD WareHouse: addwarehouse.com World Federation of ADHD: adhd-federation.org ADDConsults: https://www.hines.net/. Future evaluation if deemed necessary and/or to determine effectiveness of recommended interventions.   [name], Psy.D. Licensed Psychologist - [facility] [id number]            Dolores Lory, PsyD

## 2022-07-01 ENCOUNTER — Encounter: Payer: Self-pay | Admitting: Internal Medicine

## 2022-07-01 LAB — CERVICOVAGINAL ANCILLARY ONLY
Bacterial Vaginitis (gardnerella): NEGATIVE
Candida Glabrata: NEGATIVE
Candida Vaginitis: POSITIVE — AB
Chlamydia: NEGATIVE
Comment: NEGATIVE
Comment: NEGATIVE
Comment: NEGATIVE
Comment: NEGATIVE
Comment: NEGATIVE
Comment: NORMAL
Neisseria Gonorrhea: NEGATIVE
Trichomonas: NEGATIVE

## 2022-07-01 MED ORDER — FLUCONAZOLE 150 MG PO TABS: 150.0000 mg | ORAL_TABLET | Freq: Once | ORAL | 0 refills | Status: AC

## 2022-07-01 MED ORDER — GUANFACINE HCL ER 1 MG PO TB24: 1.0000 mg | ORAL_TABLET | Freq: Every day | ORAL | 0 refills | Status: DC

## 2022-07-01 NOTE — Addendum Note (Signed)
Addended by: Jearld Fenton on: 07/01/2022 02:05 PM   Modules accepted: Orders

## 2022-07-13 ENCOUNTER — Encounter: Payer: Self-pay | Admitting: Internal Medicine

## 2022-07-13 DIAGNOSIS — M545 Low back pain, unspecified: Secondary | ICD-10-CM

## 2022-07-18 ENCOUNTER — Encounter: Payer: Self-pay | Admitting: Internal Medicine

## 2022-07-18 ENCOUNTER — Ambulatory Visit: Payer: Self-pay | Admitting: *Deleted

## 2022-07-18 ENCOUNTER — Ambulatory Visit
Admission: RE | Admit: 2022-07-18 | Discharge: 2022-07-18 | Disposition: A | Discharge status: Home / Self Care | Payer: No Typology Code available for payment source | Attending: Internal Medicine | Admitting: Internal Medicine

## 2022-07-18 ENCOUNTER — Ambulatory Visit: Payer: No Typology Code available for payment source | Admitting: Internal Medicine

## 2022-07-18 ENCOUNTER — Ambulatory Visit
Admission: RE | Admit: 2022-07-18 | Discharge: 2022-07-18 | Disposition: A | Discharge status: Home / Self Care | Payer: No Typology Code available for payment source | Source: Ambulatory Visit | Attending: Internal Medicine | Admitting: Internal Medicine

## 2022-07-18 VITALS — BP 106/76 | HR 79 | Temp 96.8°F | Wt 150.0 lb

## 2022-07-18 DIAGNOSIS — R112 Nausea with vomiting, unspecified: Secondary | ICD-10-CM

## 2022-07-18 DIAGNOSIS — M545 Low back pain, unspecified: Secondary | ICD-10-CM | POA: Diagnosis not present

## 2022-07-18 DIAGNOSIS — R1084 Generalized abdominal pain: Secondary | ICD-10-CM | POA: Insufficient documentation

## 2022-07-18 NOTE — Telephone Encounter (Signed)
  Chief Complaint: Abd pain for 2 days Symptoms: Abd pain from bottom of ribs to pelvis.   Not on period does not feel like her normal cramps.  Also very tender in her stomach when she presses on it. Frequency: Started 2 days ago.  She did vomit but can't tell if the vomiting is from the throat condition she has or if it's from the abd pain. Pertinent Negatives: Patient denies diarrhea. Disposition: [] ED /[] Urgent Care (no appt availability in office) / [x] Appointment(In office/virtual)/ []  [place]/ [] Home Care/ [] Refused Recommended Disposition /[] [place] [facility]/ []  Follow-up with PCP Additional Notes: Appt made for today with [name], NP for 2:20.

## 2022-07-18 NOTE — Progress Notes (Signed)
Subjective:    Patient ID: Michele Dunlap, female    DOB: 24-Jun-2000, 22 y.o.   MRN: [id number]  HPI  Patient presents to clinic today with complaint of generalized abdominal pain and vomiting.  This started 2-3 days ago. She describes the pain as a sharp pain under her left rib cage but now she reports generalized soreness. She reports the pain is constant. She reports associated vomiting x 2 in the last 3 days. She reports decreased appetite. She denies reflux, constipation or diarrhea. She denies recent change in diet or medications. She has been recently seen for low back pain on 3/1. Urinalysis at that time did not show any evidence of infection. It was presumed to be muscular and she was treated with Naproxen or Methocarbamol. She was reevaluated 3/8, urinalysis was again normal. Wet prep was positive for yeast, which she was treated for with Diflucan but this has not really improved any of her symptoms.  She reports she is sexually active but does not have any pain with intercourse.  She is not having any urinary or vaginal symptoms at this time.  She is not concerned about STDs.  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  Since that time, she has reports persistent back pain, xray lumbar spine was ordered but she has not had this done yet. She was referred for PT and starts this Thursday.  Review of Systems     Past Medical History:  Diagnosis Date   Allergy    Complication of anesthesia    Started to wake during EGD (age 34)   Migraine    couple times per week    Current Outpatient Medications  Medication Sig Dispense Refill   gabapentin (NEURONTIN) 300 MG capsule Take 300 mg by mouth 2 (two) times daily.     guanFACINE (INTUNIV) 1 MG TB24 ER tablet Take 1 tablet (1 mg total) by mouth daily. 30 tablet 0   methocarbamol (ROBAXIN) 500 MG tablet Take 1 tablet (500 mg total) by mouth every 8  (eight) hours as needed for muscle spasms. 15 tablet 0   naproxen (NAPROSYN) 375 MG tablet Take 1 tablet (375 mg total) by mouth 2 (two) times daily with a meal. 10 tablet 0   Norgestimate-Ethinyl Estradiol Triphasic (TRI-ESTARYLLA) 0.18/0.215/0.25 MG-35 MCG tablet Take 1 tablet by mouth daily. 84 tablet 1   omeprazole (PRILOSEC) 40 MG capsule Take 1 capsule (40 mg total) by mouth daily. 90 capsule 1   phenazopyridine (PYRIDIUM) 200 MG tablet Take 1 tablet (200 mg total) by mouth 3 (three) times daily as needed for pain. 10 tablet 0   traZODone (DESYREL) 50 MG tablet TAKE 1/2 TO 1 TABLET BY MOUTH AT BEDTIME AS NEEDED FOR SLEEP 90 tablet 0   venlafaxine (EFFEXOR) 37.5 MG tablet Take 75 mg by mouth daily.     No current facility-administered medications for this visit.    Allergies  Allergen Reactions   Multihance [Gadobenate] Nausea And Vomiting    Family History  Problem Relation Age of Onset   Hypertension Mother    Healthy Father    Down syndrome Sister    Hypertension Maternal Grandmother    Hypertension Maternal Grandfather    Hypertension Paternal Grandmother    Hypertension Paternal Grandfather     Social History   Socioeconomic History   Marital status: Single    Spouse name: Not on file   Number  of children: Not on file   Years of education: Not on file   Highest education level: Not on file  Occupational History   Not on file  Tobacco Use   Smoking status: Never   Smokeless tobacco: Never  Vaping Use   Vaping Use: Never used  Substance and Sexual Activity   Alcohol use: Never   Drug use: Never   Sexual activity: Yes  Other Topics Concern   Not on file  Social History Narrative   Michele Dunlap is currently in 11th grade at [employer]. She lives at home with mother and sister. She has 3 dogs. She likes to dance.   Social Determinants of Health   Financial Resource Strain: Not on file  Food Insecurity: Not on file  Transportation Needs: Not on file  Physical  Activity: Not on file  Stress: Not on file  Social Connections: Not on file  Intimate Partner Violence: Not on file     Constitutional: Denies fever, malaise, fatigue, headache or abrupt weight changes.  Respiratory: Denies difficulty breathing, shortness of breath, cough or sputum production.   Cardiovascular: Denies chest pain, chest tightness, palpitations or swelling in the hands or feet.  Gastrointestinal: Patient reports decreased appetite, generalized abdominal pain and vomiting.  Denies bloating, constipation, diarrhea or blood in the stool.  GU: Denies urgency, frequency, pain with urination, burning sensation, blood in urine, odor or discharge. Musculoskeletal: Patient reports low back pain.  Denies decrease in range of motion, difficulty with gait,or joint swelling.  Skin: Denies redness, rashes, lesions or ulcercations.  Neurological: Denies numbness, tingling, weakness or problems with balance and coordination.   No other specific complaints in a complete review of systems (except as listed in HPI above).  Objective:   Physical Exam   BP 106/76 (BP Location: Left Arm, Patient Position: Sitting, Cuff Size: Normal)   Pulse 79   Temp (!) 96.8 F (36 C) (Temporal)   Wt 150 lb (68 kg)   SpO2 96%   BMI 30.30 kg/m   Wt Readings from Last 3 Encounters:  06/29/22 150 lb (68 kg)  06/24/22 150 lb (68 kg)  06/16/22 148 lb 9.6 oz (67.4 kg)    General: Appears her stated age, obese, in NAD. Skin: Warm, dry and intact.  HEENT: Head: normal shape and size; Eyes: sclera white, no icterus, conjunctiva pink, PERRLA and EOMs intact;   Cardiovascular: Normal rate and rhythm. S1,S2 noted.  No murmur, rubs or gallops noted.  Pulmonary/Chest: Normal effort and positive vesicular breath sounds. No respiratory distress. No wheezes, rales or ronchi noted.  Abdomen: Soft and generally tender.  Hypoactive bowel sounds. No distention or masses noted. Liver, spleen and kidneys non palpable.   No CVA tenderness noted. Musculoskeletal: Normal flexion, extension, rotation of the spine.  Mild bony tenderness noted over the thoracic lumbar junction.  Pain with palpation of the right paralumbar muscles.  No difficulty with gait.  Neurological: Alert and oriented.  Coordination normal.    BMET    Component Value Date/Time   NA 139 06/02/2022 1339   K 4.2 06/02/2022 1339   CL 105 06/02/2022 1339   CO2 25 06/02/2022 1339   GLUCOSE 115 (H) 06/02/2022 1339   BUN 10 06/02/2022 1339   CREATININE 0.74 06/02/2022 1339   CALCIUM 9.2 06/02/2022 1339   GFRNONAA NOT CALCULATED 02/02/2018 0039   GFRAA NOT CALCULATED 02/02/2018 0039    Lipid Panel     Component Value Date/Time   CHOL 181 06/02/2022 1339  TRIG 124 06/02/2022 1339   HDL 57 06/02/2022 1339   CHOLHDL 3.2 06/02/2022 1339   LDLCALC 101 (H) 06/02/2022 1339    CBC    Component Value Date/Time   WBC 8.8 11/25/2021 1018   RBC 4.71 11/25/2021 1018   HGB 13.7 11/25/2021 1018   HCT 41.7 11/25/2021 1018   PLT 305 11/25/2021 1018   MCV 88.5 11/25/2021 1018   MCH 29.1 11/25/2021 1018   MCHC 32.9 11/25/2021 1018   RDW 12.3 11/25/2021 1018   LYMPHSABS 1.8 02/02/2018 0039   MONOABS 1.7 (H) 02/02/2018 0039   EOSABS 0.1 02/02/2018 0039   BASOSABS 0.1 02/02/2018 0039    Hgb A1C Lab Results  Component Value Date   HGBA1C 4.9 11/25/2021           Assessment & Plan:   Generalized Abdominal Pain, Nausea and Vomiting:  KUB obtained today We will check CBC, c-Met, amylase and lipase She does not feel like we need to recheck urinalysis or wet prep at this time Consider CT abdomen/pelvis if symptoms persist or worsen Work and school note provided  Low Back Pain without Sciatica:  She will get the x-ray lumbar spine that was ordered prior Encouraged regular stretching, Naproxen and Methocarbamol as needed She will move forward with PT which is scheduled for this Thursday  RTC in 6 months for annual exam [name], NP

## 2022-07-18 NOTE — Patient Instructions (Signed)
Abdominal Pain, Adult Many things can cause belly (abdominal) pain. Most times, belly pain is not dangerous. Many cases of belly pain can be watched and treated at home. Sometimes, though, belly pain is serious. Your doctor will try to find the cause of your belly pain. Follow these instructions at home:  Medicines Take over-the-counter and prescription medicines only as told by your doctor. Do not take medicines that help you poop (laxatives) unless told by your doctor. General instructions Watch your belly pain for any changes. Drink enough fluid to keep your pee (urine) pale yellow. Keep all follow-up visits as told by your doctor. This is important. Contact a doctor if: Your belly pain changes or gets worse. You are not hungry, or you lose weight without trying. You are having trouble pooping (constipated) or have watery poop (diarrhea) for more than 2-3 days. You have pain when you pee or poop. Your belly pain wakes you up at night. Your pain gets worse with meals, after eating, or with certain foods. You are vomiting and cannot keep anything down. You have a fever. You have blood in your pee. Get help right away if: Your pain does not go away as soon as your doctor says it should. You cannot stop vomiting. Your pain is only in areas of your belly, such as the right side or the left lower part of the belly. You have bloody or black poop, or poop that looks like tar. You have very bad pain, cramping, or bloating in your belly. You have signs of not having enough fluid or water in your body (dehydration), such as: Dark pee, very little pee, or no pee. Cracked lips. Dry mouth. Sunken eyes. Sleepiness. Weakness. You have trouble breathing or chest pain. Summary Many cases of belly pain can be watched and treated at home. Watch your belly pain for any changes. Take over-the-counter and prescription medicines only as told by your doctor. Contact a doctor if your belly pain  changes or gets worse. Get help right away if you have very bad pain, cramping, or bloating in your belly. This information is not intended to replace advice given to you by your health care provider. Make sure you discuss any questions you have with your health care provider. Document Revised: 08/20/2018 Document Reviewed: 08/20/2018 Elsevier Patient Education  2023 [employer] Inc[employer]  

## 2022-07-18 NOTE — Telephone Encounter (Signed)
Reason for Disposition  [1] MILD-MODERATE pain AND [2] constant AND [3] present > 2 hours  Answer Assessment - Initial Assessment Questions 1. LOCATION: "Where does it hurt?"      Having abd pain.    Pain from bottom of my ribs to my pelvis.   I've had cramps but this doesn't feel like that.   When I touch my stomach it hurts.  I've been vomiting but I also have a throat condition that causes me to vomit.   2. RADIATION: "Does the pain shoot anywhere else?" (e.g., chest, back)     I was coming in to get an x ray for back pain.   BUt now I have pain in my abd.   That's why I'm calling.     I'm for the x ray one day this week.    3. ONSET: "When did the pain begin?" (e.g., minutes, hours or days ago)      No burning with urination, or frequency.      Pain started 2 days ago in my abd.    I don't feel good. 4. SUDDEN: "Gradual or sudden onset?"     Sudden pain then when I put pressure on my stomach it is really tender. 5. PATTERN "Does the pain come and go, or is it constant?"    - If it comes and goes: "How long does it last?" "Do you have pain now?"     (Note: Comes and goes means the pain is intermittent. It goes away completely between bouts.)    - If constant: "Is it getting better, staying the same, or getting worse?"      (Note: Constant means the pain never goes away completely; most serious pain is constant and gets worse.)      Not having period. 6. SEVERITY: "How bad is the pain?"  (e.g., Scale 1-10; mild, moderate, or severe)    - MILD (1-3): Doesn't interfere with normal activities, abdomen soft and not tender to touch.     - MODERATE (4-7): Interferes with normal activities or awakens from sleep, abdomen tender to touch.     - SEVERE (8-10): Excruciating pain, doubled over, unable to do any normal activities.       Moderate 7. RECURRENT SYMPTOM: "Have you ever had this type of stomach pain before?" If Yes, ask: "When was the last time?" and "What happened that time?"      No 8.  CAUSE: "What do you think is causing the stomach pain?"     Not asked 9. RELIEVING/AGGRAVATING FACTORS: "What makes it better or worse?" (e.g., antacids, bending or twisting motion, bowel movement)     Not asked 10. OTHER SYMPTOMS: "Do you have any other symptoms?" (e.g., back pain, diarrhea, fever, urination pain, vomiting)       No   Also having back pain.     11. PREGNANCY: "Is there any chance you are pregnant?" "When was your last menstrual period?"       No  Protocols used: Abdominal Pain - [facility]

## 2022-07-19 ENCOUNTER — Encounter: Payer: Self-pay | Admitting: Internal Medicine

## 2022-07-19 DIAGNOSIS — R1084 Generalized abdominal pain: Secondary | ICD-10-CM

## 2022-07-19 LAB — CBC
HCT: 39 % (ref 35.0–45.0)
Hemoglobin: 13.3 g/dL (ref 11.7–15.5)
MCH: 29.5 pg (ref 27.0–33.0)
MCHC: 34.1 g/dL (ref 32.0–36.0)
MCV: 86.5 fL (ref 80.0–100.0)
MPV: 10.1 fL (ref 7.5–12.5)
Platelets: 312 10*3/uL (ref 140–400)
RBC: 4.51 10*6/uL (ref 3.80–5.10)
RDW: 12 % (ref 11.0–15.0)
WBC: 10.6 10*3/uL (ref 3.8–10.8)

## 2022-07-19 LAB — COMPLETE METABOLIC PANEL WITH GFR
AG Ratio: 1.7 (calc) (ref 1.0–2.5)
ALT: 14 U/L (ref 6–29)
AST: 18 U/L (ref 10–30)
Albumin: 4.4 g/dL (ref 3.6–5.1)
Alkaline phosphatase (APISO): 86 U/L (ref 31–125)
BUN: 8 mg/dL (ref 7–25)
CO2: 23 mmol/L (ref 20–32)
Calcium: 9.8 mg/dL (ref 8.6–10.2)
Chloride: 104 mmol/L (ref 98–110)
Creat: 0.74 mg/dL (ref 0.50–0.96)
Globulin: 2.6 g/dL (calc) (ref 1.9–3.7)
Glucose, Bld: 85 mg/dL (ref 65–99)
Potassium: 4.4 mmol/L (ref 3.5–5.3)
Sodium: 137 mmol/L (ref 135–146)
Total Bilirubin: 0.8 mg/dL (ref 0.2–1.2)
Total Protein: 7 g/dL (ref 6.1–8.1)
eGFR: 119 mL/min/{1.73_m2} (ref >=60)

## 2022-07-19 LAB — AMYLASE: Amylase: 28 U/L (ref 21–101)

## 2022-07-19 LAB — LIPASE: Lipase: 7 U/L (ref 7–60)

## 2022-07-22 ENCOUNTER — Ambulatory Visit: Payer: No Typology Code available for payment source | Admitting: Internal Medicine

## 2022-07-22 ENCOUNTER — Encounter: Payer: Self-pay | Admitting: Internal Medicine

## 2022-07-22 VITALS — BP 124/80 | HR 76 | Temp 96.8°F | Wt 150.0 lb

## 2022-07-22 DIAGNOSIS — M2550 Pain in unspecified joint: Secondary | ICD-10-CM

## 2022-07-22 DIAGNOSIS — M2540 Effusion, unspecified joint: Secondary | ICD-10-CM | POA: Diagnosis not present

## 2022-07-22 DIAGNOSIS — R21 Rash and other nonspecific skin eruption: Secondary | ICD-10-CM | POA: Diagnosis not present

## 2022-07-22 DIAGNOSIS — Z8269 Family history of other diseases of the musculoskeletal system and connective tissue: Secondary | ICD-10-CM | POA: Diagnosis not present

## 2022-07-22 MED ORDER — PREDNISONE 10 MG PO TABS: ORAL_TABLET | ORAL | 0 refills | Status: DC

## 2022-07-22 NOTE — Progress Notes (Deleted)
Subjective:    Patient ID: Michele Dunlap, female    DOB: 04/28/2000, 22 y.o.   MRN: [id number]  HPI  Patient presents to clinic today with complaint of a rash of her face.  She noticed this.  Review of Systems     Past Medical History:  Diagnosis Date   Allergy    Complication of anesthesia    Started to wake during EGD (age 51)   Migraine    couple times per week    Current Outpatient Medications  Medication Sig Dispense Refill   gabapentin (NEURONTIN) 300 MG capsule Take 300 mg by mouth 2 (two) times daily.     guanFACINE (INTUNIV) 1 MG TB24 ER tablet Take 1 tablet (1 mg total) by mouth daily. 30 tablet 0   methocarbamol (ROBAXIN) 500 MG tablet Take 1 tablet (500 mg total) by mouth every 8 (eight) hours as needed for muscle spasms. 15 tablet 0   naproxen (NAPROSYN) 375 MG tablet Take 1 tablet (375 mg total) by mouth 2 (two) times daily with a meal. 10 tablet 0   Norgestimate-Ethinyl Estradiol Triphasic (TRI-ESTARYLLA) 0.18/0.215/0.25 MG-35 MCG tablet Take 1 tablet by mouth daily. 84 tablet 1   omeprazole (PRILOSEC) 40 MG capsule Take 1 capsule (40 mg total) by mouth daily. 90 capsule 1   traZODone (DESYREL) 50 MG tablet TAKE 1/2 TO 1 TABLET BY MOUTH AT BEDTIME AS NEEDED FOR SLEEP 90 tablet 0   venlafaxine (EFFEXOR) 37.5 MG tablet Take 75 mg by mouth daily.     No current facility-administered medications for this visit.    Allergies  Allergen Reactions   Multihance [Gadobenate] Nausea And Vomiting    Family History  Problem Relation Age of Onset   Hypertension Mother    Healthy Father    Down syndrome Sister    Hypertension Maternal Grandmother    Hypertension Maternal Grandfather    Hypertension Paternal Grandmother    Hypertension Paternal Grandfather     Social History   Socioeconomic History   Marital status: Single    Spouse name: Not on file   Number of children: Not on file   Years of education: Not on file   Highest education level: Not on file   Occupational History   Not on file  Tobacco Use   Smoking status: Never   Smokeless tobacco: Never  Vaping Use   Vaping Use: Never used  Substance and Sexual Activity   Alcohol use: Never   Drug use: Never   Sexual activity: Yes  Other Topics Concern   Not on file  Social History Narrative   Michele Dunlap is currently in 11th grade at [employer]. She lives at home with mother and sister. She has 3 dogs. She likes to dance.   Social Determinants of Health   Financial Resource Strain: Not on file  Food Insecurity: Not on file  Transportation Needs: Not on file  Physical Activity: Not on file  Stress: Not on file  Social Connections: Not on file  Intimate Partner Violence: Not on file     Constitutional: Denies fever, malaise, fatigue, headache or abrupt weight changes.  HEENT: Denies eye pain, eye redness, ear pain, ringing in the ears, wax buildup, runny nose, nasal congestion, bloody nose, or sore throat. Respiratory: Denies difficulty breathing, shortness of breath, cough or sputum production.   Cardiovascular: Denies chest pain, chest tightness, palpitations or swelling in the hands or feet.  Gastrointestinal: Denies abdominal pain, bloating, constipation, diarrhea or blood in  the stool.  GU: Denies urgency, frequency, pain with urination, burning sensation, blood in urine, odor or discharge. Musculoskeletal: Denies decrease in range of motion, difficulty with gait, muscle pain or joint pain and swelling.  Skin: Patient reports rash of face.  Denies redness, lesions or ulcercations.  Neurological: Denies dizziness, difficulty with memory, difficulty with speech or problems with balance and coordination.  Psych: Denies anxiety, depression, SI/HI.  No other specific complaints in a complete review of systems (except as listed in HPI above).  Objective:   Physical Exam   LMP 07/04/2022 (Approximate)  Wt Readings from Last 3 Encounters:  07/18/22 150 lb (68 kg)  06/29/22  150 lb (68 kg)  06/24/22 150 lb (68 kg)    General: Appears their stated age, well developed, well nourished in NAD. Skin: Warm, dry and intact. No rashes, lesions or ulcerations noted.  HEENT: Head: normal shape and size; Eyes: sclera white, no icterus, conjunctiva pink, PERRLA and EOMs intact; Ears: Tm's gray and intact, normal light reflex; Nose: mucosa pink and moist, septum midline; Throat/Mouth: Teeth present, mucosa pink and moist, no exudate, lesions or ulcerations noted.  Neck:  Neck supple, trachea midline. No masses, lumps or thyromegaly present.  Cardiovascular: Normal rate and rhythm. S1,S2 noted.  No murmur, rubs or gallops noted. No JVD or BLE edema. No carotid bruits noted. Pulmonary/Chest: Normal effort and positive vesicular breath sounds. No respiratory distress. No wheezes, rales or ronchi noted.  Abdomen: Soft and nontender. Normal bowel sounds. No distention or masses noted. Liver, spleen and kidneys non palpable. Musculoskeletal: Normal range of motion. No signs of joint swelling. No difficulty with gait.  Neurological: Alert and oriented. Cranial nerves II-XII grossly intact. Coordination normal.  Psychiatric: Mood and affect normal. Behavior is normal. Judgment and thought content normal.    BMET    Component Value Date/Time   NA 137 07/18/2022 1436   K 4.4 07/18/2022 1436   CL 104 07/18/2022 1436   CO2 23 07/18/2022 1436   GLUCOSE 85 07/18/2022 1436   BUN 8 07/18/2022 1436   CREATININE 0.74 07/18/2022 1436   CALCIUM 9.8 07/18/2022 1436   GFRNONAA NOT CALCULATED 02/02/2018 0039   GFRAA NOT CALCULATED 02/02/2018 0039    Lipid Panel     Component Value Date/Time   CHOL 181 06/02/2022 1339   TRIG 124 06/02/2022 1339   HDL 57 06/02/2022 1339   CHOLHDL 3.2 06/02/2022 1339   LDLCALC 101 (H) 06/02/2022 1339    CBC    Component Value Date/Time   WBC 10.6 07/18/2022 1436   RBC 4.51 07/18/2022 1436   HGB 13.3 07/18/2022 1436   HCT 39.0 07/18/2022 1436    PLT 312 07/18/2022 1436   MCV 86.5 07/18/2022 1436   MCH 29.5 07/18/2022 1436   MCHC 34.1 07/18/2022 1436   RDW 12.0 07/18/2022 1436   LYMPHSABS 1.8 02/02/2018 0039   MONOABS 1.7 (H) 02/02/2018 0039   EOSABS 0.1 02/02/2018 0039   BASOSABS 0.1 02/02/2018 0039    Hgb A1C Lab Results  Component Value Date   HGBA1C 4.9 11/25/2021           Assessment & Plan:     RTC in 6 months for annual exam [name], NP

## 2022-07-22 NOTE — Progress Notes (Signed)
Subjective:    Patient ID: Michele Dunlap, female    DOB: Jan 23, 2001, 22 y.o.   MRN: [id number]  HPI  Patient presents to clinic today with complaint of a rash of her face.  She noticed this about 1 week ago.  She reports the rash itches and burns.  It has not seem to spread.  She has eczema but she reports this is different.  She denies changes in make up or facial products.  She reports associated fatigue, multiple joint pain and joint swelling.  She is concerned that she has lupus.  She reports her mother has an autoimmune disease and her grandfather has lupus.  She has tried Benadryl OTC with minimal relief of symptoms.  Review of Systems     Past Medical History:  Diagnosis Date   Allergy    Complication of anesthesia    Started to wake during EGD (age 59)   Migraine    couple times per week    Current Outpatient Medications  Medication Sig Dispense Refill   gabapentin (NEURONTIN) 300 MG capsule Take 300 mg by mouth 2 (two) times daily.     guanFACINE (INTUNIV) 1 MG TB24 ER tablet Take 1 tablet (1 mg total) by mouth daily. 30 tablet 0   methocarbamol (ROBAXIN) 500 MG tablet Take 1 tablet (500 mg total) by mouth every 8 (eight) hours as needed for muscle spasms. 15 tablet 0   naproxen (NAPROSYN) 375 MG tablet Take 1 tablet (375 mg total) by mouth 2 (two) times daily with a meal. 10 tablet 0   Norgestimate-Ethinyl Estradiol Triphasic (TRI-ESTARYLLA) 0.18/0.215/0.25 MG-35 MCG tablet Take 1 tablet by mouth daily. 84 tablet 1   omeprazole (PRILOSEC) 40 MG capsule Take 1 capsule (40 mg total) by mouth daily. 90 capsule 1   traZODone (DESYREL) 50 MG tablet TAKE 1/2 TO 1 TABLET BY MOUTH AT BEDTIME AS NEEDED FOR SLEEP 90 tablet 0   venlafaxine (EFFEXOR) 37.5 MG tablet Take 75 mg by mouth daily.     No current facility-administered medications for this visit.    Allergies  Allergen Reactions   Multihance [Gadobenate] Nausea And Vomiting    Family History  Problem Relation Age of  Onset   Hypertension Mother    Healthy Father    Down syndrome Sister    Hypertension Maternal Grandmother    Hypertension Maternal Grandfather    Hypertension Paternal Grandmother    Hypertension Paternal Grandfather     Social History   Socioeconomic History   Marital status: Single    Spouse name: Not on file   Number of children: Not on file   Years of education: Not on file   Highest education level: Not on file  Occupational History   Not on file  Tobacco Use   Smoking status: Never   Smokeless tobacco: Never  Vaping Use   Vaping Use: Never used  Substance and Sexual Activity   Alcohol use: Never   Drug use: Never   Sexual activity: Yes  Other Topics Concern   Not on file  Social History Narrative   Wells is currently in 11th grade at [employer]. She lives at home with mother and sister. She has 3 dogs. She likes to dance.   Social Determinants of Health   Financial Resource Strain: Not on file  Food Insecurity: Not on file  Transportation Needs: Not on file  Physical Activity: Not on file  Stress: Not on file  Social Connections: Not on file  Intimate Partner Violence: Not on file     Constitutional: Patient reports fatigue.  Denies fever, malaise, headache or abrupt weight changes.  HEENT: Denies eye pain, eye redness, ear pain, ringing in the ears, wax buildup, runny nose, nasal congestion, bloody nose, or sore throat. Respiratory: Denies difficulty breathing, shortness of breath, cough or sputum production.   Cardiovascular: Denies chest pain, chest tightness, palpitations or swelling in the hands or feet.  Musculoskeletal: Patient reports joint pain and joint swelling.  Denies decrease in range of motion, difficulty with gait, muscle pain.  Skin: Patient reports rash of face.  Denies redness, lesions or ulcercations.   No other specific complaints in a complete review of systems (except as listed in HPI above).  Objective:   Physical Exam   BP  124/80 (BP Location: Right Arm, Patient Position: Sitting, Cuff Size: Normal)   Pulse 76   Temp (!) 96.8 F (36 C) (Temporal)   Wt 150 lb (68 kg)   LMP 07/04/2022 (Approximate)   SpO2 94%   BMI 30.30 kg/m   Wt Readings from Last 3 Encounters:  07/18/22 150 lb (68 kg)  06/29/22 150 lb (68 kg)  06/24/22 150 lb (68 kg)    General: Appears her stated age, obese, in NAD. Skin: Warm, dry and intact.  Maculopapular rash noted of chin, bilateral cheeks adjacent to the nose and central forehead.  HEENT: Head: normal shape and size; Eyes: sclera white, no icterus, conjunctiva pink, PERRLA and EOMs intact;  Cardiovascular: Normal rate and rhythm. S1,S2 noted.  No murmur, rubs or gallops noted.  Pulmonary/Chest: Normal effort and positive vesicular breath sounds. No respiratory distress. No wheezes, rales or ronchi noted.  Musculoskeletal: No signs of joint swelling. No difficulty with gait.  Neurological: Alert and oriented.    BMET    Component Value Date/Time   NA 137 07/18/2022 1436   K 4.4 07/18/2022 1436   CL 104 07/18/2022 1436   CO2 23 07/18/2022 1436   GLUCOSE 85 07/18/2022 1436   BUN 8 07/18/2022 1436   CREATININE 0.74 07/18/2022 1436   CALCIUM 9.8 07/18/2022 1436   GFRNONAA NOT CALCULATED 02/02/2018 0039   GFRAA NOT CALCULATED 02/02/2018 0039    Lipid Panel     Component Value Date/Time   CHOL 181 06/02/2022 1339   TRIG 124 06/02/2022 1339   HDL 57 06/02/2022 1339   CHOLHDL 3.2 06/02/2022 1339   LDLCALC 101 (H) 06/02/2022 1339    CBC    Component Value Date/Time   WBC 10.6 07/18/2022 1436   RBC 4.51 07/18/2022 1436   HGB 13.3 07/18/2022 1436   HCT 39.0 07/18/2022 1436   PLT 312 07/18/2022 1436   MCV 86.5 07/18/2022 1436   MCH 29.5 07/18/2022 1436   MCHC 34.1 07/18/2022 1436   RDW 12.0 07/18/2022 1436   LYMPHSABS 1.8 02/02/2018 0039   MONOABS 1.7 (H) 02/02/2018 0039   EOSABS 0.1 02/02/2018 0039   BASOSABS 0.1 02/02/2018 0039    Hgb A1C Lab Results   Component Value Date   HGBA1C 4.9 11/25/2021           Assessment & Plan:   Rash of Face, Multiple Joint Pain, Joint Swelling, Family History of Lupus:  Will check ANA, ESR, CRP, RF, C3, C4, antiphospholipid antibody, anti-double-stranded DNA Rx for Pred taper x 6 days Consider referral to rheumatology if autoimmune labs are positive   RTC in 6 months for annual exam Michele Silversmith, NP

## 2022-07-22 NOTE — Patient Instructions (Signed)
Rash, Adult A rash is a change in the color of your skin. A rash can also change the way your skin feels. There are many different conditions and factors that can cause a rash. Some rashes may disappear after a few days, but some may last for a few weeks. Common causes of rashes include: Viral infections, such as: Colds. Measles. Hand, foot, and mouth disease. Bacterial infections, such as: Scarlet fever. Impetigo. Fungal infections, such as Candida. Allergic reactions to food, medicines, or skin care products. Follow these instructions at home: The goal of treatment is to stop the itching and keep the rash from spreading. Pay attention to any changes in your symptoms. Follow these instructions to help with your condition: Medicine Take or apply over-the-counter and prescription medicines only as told by your health care provider. These may include: Corticosteroid creams to treat red or swollen skin. Anti-itch lotions. Oral allergy medicines (antihistamines). Oral corticosteroids for severe symptoms.  Skin care Apply cool compresses to the affected areas. Do not scratch or rub your skin. Avoid covering the rash. Make sure the rash is exposed to air as much as possible. Managing itching and discomfort Avoid hot showers or baths, which can make itching worse. A cold shower may help. Try taking a bath with: Epsom salts. Follow manufacturer instructions on the packaging. You can get these at your local pharmacy or grocery store. Baking soda. Pour a small amount into the bath as told by your health care provider. Colloidal oatmeal. Follow manufacturer instructions on the packaging. You can get this at your local pharmacy or grocery store. Try applying baking soda paste to your skin. Stir water into baking soda until it reaches a paste-like consistency. Try applying calamine lotion. This is an over-the-counter lotion that helps to relieve itchiness. Keep cool and out of the sun. Sweating  and being hot can make itching worse. General instructions  Rest as needed. Drink enough fluid to keep your urine pale yellow. Wear loose-fitting clothing. Avoid scented soaps, detergents, and perfumes. Use gentle soaps, detergents, perfumes, and other cosmetic products. Avoid any substance that causes your rash. Keep a journal to help track what causes your rash. Write down: What you eat. What cosmetic products you use. What you drink. What you wear. This includes jewelry. Keep all follow-up visits as told by your health care provider. This is important. Contact a health care provider if: You sweat at night. You lose weight. You urinate more than normal. You urinate less than normal, or you notice that your urine is a darker color than usual. You feel weak. You vomit. Your skin or the whites of your eyes look yellow (jaundice). Your skin: Tingles. Is numb. Your rash: Does not go away after several days. Gets worse. You are: Unusually thirsty. More tired than normal. You have: New symptoms. Pain in your abdomen. A fever. Diarrhea. Get help right away if you: Have a fever and your symptoms suddenly get worse. Develop confusion. Have a severe headache or a stiff neck. Have severe joint pains or stiffness. Have a seizure. Develop a rash that covers all or most of your body. The rash may or may not be painful. Develop blisters that: Are on top of the rash. Grow larger or grow together. Are painful. Are inside your nose or mouth. Develop a rash that: Looks like purple pinprick-sized spots all over your body. Has a "bull's eye" or looks like a target. Is not related to sun exposure, is red and painful, and causes   your skin to peel. Summary A rash is a change in the color of your skin. Some rashes disappear after a few days, but some may last for a few weeks. The goal of treatment is to stop the itching and keep the rash from spreading. Take or apply over-the-counter  and prescription medicines only as told by your health care provider. Contact a health care provider if you have new or worsening symptoms. Keep all follow-up visits as told by your health care provider. This is important. This information is not intended to replace advice given to you by your health care provider. Make sure you discuss any questions you have with your health care provider. Document Revised: 10/12/2021 Document Reviewed: 01/21/2021 Elsevier Patient Education  2023 Elsevier Inc.  

## 2022-07-24 ENCOUNTER — Other Ambulatory Visit: Payer: Self-pay | Admitting: Internal Medicine

## 2022-07-25 NOTE — Telephone Encounter (Signed)
Requested Prescriptions  Pending Prescriptions Disp Refills   guanFACINE (INTUNIV) 1 MG TB24 ER tablet [Pharmacy Med Name: GUANFACINE HCL ER 1 MG TABLET] 90 tablet 1    Sig: TAKE 1 TABLET BY MOUTH EVERY DAY     Cardiovascular: Alpha-2 Agonists - guanfacine Passed - 07/24/2022  2:31 PM      Passed - Last BP in normal range    BP Readings from Last 1 Encounters:  07/22/22 124/80         Passed - Last Heart Rate in normal range    Pulse Readings from Last 1 Encounters:  07/22/22 76         Passed - Valid encounter within last 6 months    Recent Outpatient Visits           3 days ago Rash of face   [facility] [place], Coralie Keens, NP   1 week ago Generalized abdominal pain   [facility], Coralie Keens, NP   3 weeks ago [facility] [place], Coralie Keens, NP   1 month ago [facility], Coralie Keens, NP   1 month ago Pure hypercholesterolemia   [facility] [place], Coralie Keens, NP       Future Appointments             In 5 months Baity, Coralie Keens, NP [facility]

## 2022-07-26 ENCOUNTER — Encounter: Payer: Self-pay | Admitting: Internal Medicine

## 2022-07-26 DIAGNOSIS — L700 Acne vulgaris: Secondary | ICD-10-CM

## 2022-07-26 LAB — ANTIPHOSPHOLOPID AB PANEL
Anticardiolipin IgA: <2 APL-U/mL (ref <20.0)
Anticardiolipin IgG: <2 GPL-U/mL (ref <20.0)
Anticardiolipin IgM: <2 MPL-U/mL (ref <20.0)
Beta-2 Glyco 1 IgA: <2 U/mL (ref <20.0)
Beta-2 Glyco 1 IgM: <2 U/mL (ref <20.0)
Beta-2 Glyco I IgG: <2 U/mL (ref <20.0)
Phosphatidylserine/Prothrombin Ab (IgG): <9 U (ref <=30)
Phosphatidylserine/Prothrombin Ab (IgM): 14 U (ref <=30)

## 2022-07-26 LAB — ANA: Anti Nuclear Antibody (ANA): NEGATIVE

## 2022-07-26 LAB — ANTI-DNA ANTIBODY, DOUBLE-STRANDED: ds DNA Ab: <1 IU/mL

## 2022-07-26 LAB — SEDIMENTATION RATE: Sed Rate: 2 mm/h (ref 0–20)

## 2022-07-26 LAB — C3 AND C4
C3 Complement: 133 mg/dL (ref 83–193)
C4 Complement: 23 mg/dL (ref 15–57)

## 2022-07-26 LAB — C-REACTIVE PROTEIN: CRP: 7.4 mg/L (ref <8.0)

## 2022-07-26 LAB — RHEUMATOID FACTOR: Rheumatoid fact SerPl-aCnc: <14 IU/mL (ref <14)

## 2022-07-26 NOTE — Addendum Note (Signed)
Addended by: Ashley Royalty E on: 07/26/2022 02:19 PM   Modules accepted: Orders

## 2022-07-29 ENCOUNTER — Other Ambulatory Visit: Payer: Self-pay | Admitting: Internal Medicine

## 2022-07-29 NOTE — Telephone Encounter (Signed)
Requested Prescriptions  Pending Prescriptions Disp Refills   traZODone (DESYREL) 50 MG tablet [Pharmacy Med Name: TRAZODONE 50 MG TABLET] 90 tablet 0    Sig: TAKE 1/2 TO 1 TABLET BY MOUTH AT BEDTIME AS NEEDED FOR SLEEP     Psychiatry: Antidepressants - Serotonin Modulator Passed - 07/29/2022  2:32 PM      Passed - Completed PHQ-2 or PHQ-9 in the last 360 days      Passed - Valid encounter within last 6 months    Recent Outpatient Visits           1 week ago Rash of face   [facility] [place], Salvadore Oxford, NP   1 week ago Generalized abdominal pain   [facility] [place], Salvadore Oxford, NP   1 month ago Dysuria   [facility], Salvadore Oxford, NP   1 month ago Dysuria   [facility] [place], Salvadore Oxford, NP   1 month ago Pure hypercholesterolemia   [facility] [place], Salvadore Oxford, NP       Future Appointments             In 5 months Baity, Salvadore Oxford, NP  [facility], [facility]

## 2022-08-01 ENCOUNTER — Encounter: Payer: Self-pay | Admitting: Internal Medicine

## 2022-08-01 DIAGNOSIS — G43C1 Periodic headache syndromes in child or adult, intractable: Secondary | ICD-10-CM

## 2022-08-02 ENCOUNTER — Ambulatory Visit: Admission: RE | Admit: 2022-08-02 | Payer: No Typology Code available for payment source | Source: Ambulatory Visit

## 2022-08-03 ENCOUNTER — Encounter: Payer: Self-pay | Admitting: Internal Medicine

## 2022-08-03 DIAGNOSIS — F988 Other specified behavioral and emotional disorders with onset usually occurring in childhood and adolescence: Secondary | ICD-10-CM

## 2022-08-03 DIAGNOSIS — F32A Depression, unspecified: Secondary | ICD-10-CM

## 2022-08-03 MED ORDER — CLONIDINE HCL ER 0.1 MG PO TB12: 0.1000 mg | ORAL_TABLET | Freq: Two times a day (BID) | ORAL | 0 refills | Status: DC

## 2022-08-04 ENCOUNTER — Ambulatory Visit
Admission: RE | Admit: 2022-08-04 | Discharge: 2022-08-04 | Disposition: A | Discharge status: Home / Self Care | Payer: No Typology Code available for payment source | Source: Ambulatory Visit | Attending: Internal Medicine | Admitting: Internal Medicine

## 2022-08-04 DIAGNOSIS — R1084 Generalized abdominal pain: Secondary | ICD-10-CM | POA: Insufficient documentation

## 2022-08-04 MED ORDER — IOHEXOL 300 MG/ML  SOLN
100.0000 mL | Freq: Once | INTRAMUSCULAR | Status: AC | PRN
  Administered 2022-08-04: 100 mL via INTRAVENOUS

## 2022-08-13 ENCOUNTER — Encounter: Payer: Self-pay | Admitting: Internal Medicine

## 2022-08-17 ENCOUNTER — Telehealth: Payer: Self-pay

## 2022-08-17 NOTE — Transitions of Care (Post Inpatient/ED Visit) (Signed)
   08/17/2022  Name: MALENY CANDY MRN: [id number] DOB: [birth year]  Today's TOC FU Call Status: Unsuccessful Call (1st Attempt) Date: 08/17/22  Attempted to reach the patient regarding the most recent Inpatient/ED visit.  Follow Up Plan: Additional outreach attempts will be made to reach the patient to complete the Transitions of Care (Post Inpatient/ED visit) call.   Oneal Grout, [facility]) [facility] [phone]

## 2022-08-18 ENCOUNTER — Encounter: Payer: Self-pay | Admitting: Internal Medicine

## 2022-08-18 ENCOUNTER — Ambulatory Visit: Payer: No Typology Code available for payment source | Admitting: Internal Medicine

## 2022-08-18 VITALS — BP 118/78 | HR 84 | Ht <= 58 in | Wt 152.0 lb

## 2022-08-18 DIAGNOSIS — L2082 Flexural eczema: Secondary | ICD-10-CM | POA: Diagnosis not present

## 2022-08-18 DIAGNOSIS — L309 Dermatitis, unspecified: Secondary | ICD-10-CM | POA: Insufficient documentation

## 2022-08-18 DIAGNOSIS — F988 Other specified behavioral and emotional disorders with onset usually occurring in childhood and adolescence: Secondary | ICD-10-CM | POA: Insufficient documentation

## 2022-08-18 MED ORDER — TRIAMCINOLONE ACETONIDE 0.1 % EX CREA: 1.0000 | TOPICAL_CREAM | Freq: Two times a day (BID) | CUTANEOUS | 2 refills | Status: DC

## 2022-08-18 MED ORDER — CLOBETASOL PROPIONATE 0.05 % EX SOLN: 1.0000 | CUTANEOUS | 0 refills | Status: DC

## 2022-08-18 MED ORDER — ATOMOXETINE HCL 10 MG PO CAPS: 10.0000 mg | ORAL_CAPSULE | Freq: Every day | ORAL | 0 refills | Status: DC

## 2022-08-18 NOTE — Progress Notes (Signed)
Subjective:    Patient ID: Michele Dunlap, female    DOB: 10-25-2000, 22 y.o.   MRN: [id number]  HPI  Patient presents to clinic today for ER follow-up.  She presented to the ER 4/22 with complaint of headache and vision changes.  CT of the head did not show any acute findings.  Lab work was unremarkable.  She was given a migraine cocktail with improvement in her symptoms.  She reports her headache has not recurred..  She has been following with neurology for migraine management but has an appointment at [facility] for second opinion in June.  She also wants to discuss her ADHD meds.  She was initially started on Intuniv but stopped due to rash.  She is now on Kapvay but she reports this makes her drowsy.  She also reports eczema. She reports this occurs in her scalp, on her arms and legs.  She reports she was given a scalp solution and ointments in the past.  She is not currently taking a daily antihistamine.  Review of Systems     Past Medical History:  Diagnosis Date   Allergy    Complication of anesthesia    Started to wake during EGD (age 22)   Migraine    couple times per week    Current Outpatient Medications  Medication Sig Dispense Refill   cloNIDine HCl (KAPVAY) 0.1 MG TB12 ER tablet Take 1 tablet (0.1 mg total) by mouth 2 (two) times daily. 60 tablet 0   gabapentin (NEURONTIN) 300 MG capsule Take 300 mg by mouth 2 (two) times daily.     methocarbamol (ROBAXIN) 500 MG tablet Take 1 tablet (500 mg total) by mouth every 8 (eight) hours as needed for muscle spasms. 15 tablet 0   naproxen (NAPROSYN) 375 MG tablet Take 1 tablet (375 mg total) by mouth 2 (two) times daily with a meal. 10 tablet 0   Norgestimate-Ethinyl Estradiol Triphasic (TRI-ESTARYLLA) 0.18/0.215/0.25 MG-35 MCG tablet Take 1 tablet by mouth daily. 84 tablet 1   omeprazole (PRILOSEC) 40 MG capsule Take 1 capsule (40 mg total) by mouth daily. 90 capsule 1   predniSONE (DELTASONE) 10 MG tablet Take 6 tabs on day  1, 5 tabs on day 2, 4 tabs on day 3, 3 tabs on day 4, 2 tabs on day 5, 1 tab on day 6 21 tablet 0   traZODone (DESYREL) 50 MG tablet TAKE 1/2 TO 1 TABLET BY MOUTH AT BEDTIME AS NEEDED FOR SLEEP 90 tablet 0   venlafaxine (EFFEXOR) 37.5 MG tablet Take 75 mg by mouth daily.     No current facility-administered medications for this visit.    Allergies  Allergen Reactions   Multihance [Gadobenate] Nausea And Vomiting    Family History  Problem Relation Age of Onset   Hypertension Mother    Healthy Father    Down syndrome Sister    Hypertension Maternal Grandmother    Hypertension Maternal Grandfather    Hypertension Paternal Grandmother    Hypertension Paternal Grandfather     Social History   Socioeconomic History   Marital status: Single    Spouse name: Not on file   Number of children: Not on file   Years of education: Not on file   Highest education level: Not on file  Occupational History   Not on file  Tobacco Use   Smoking status: Never   Smokeless tobacco: Never  Vaping Use   Vaping Use: Never used  Substance and Sexual  Activity   Alcohol use: Never   Drug use: Never   Sexual activity: Yes  Other Topics Concern   Not on file  Social History Narrative   Michele Dunlap is currently in 11th grade at [employer]. She lives at home with mother and sister. She has 3 dogs. She likes to dance.   Social Determinants of Health   Financial Resource Strain: Not on file  Food Insecurity: Not on file  Transportation Needs: Not on file  Physical Activity: Not on file  Stress: Not on file  Social Connections: Not on file  Intimate Partner Violence: Not on file     Constitutional: Pt reports intermittent headache. Denies fever, malaise, fatigue, or abrupt weight changes.  HEENT: Patient reports intermittent blurry vision with headaches.  Denies eye pain, eye redness, ear pain, ringing in the ears, wax buildup, runny nose, nasal congestion, bloody nose, or sore  throat. Respiratory: Denies difficulty breathing, shortness of breath, cough or sputum production.   Cardiovascular: Denies chest pain, chest tightness, palpitations or swelling in the hands or feet.  Musculoskeletal: Denies decrease in range of motion, difficulty with gait, muscle pain or joint pain and swelling.  Skin: Patient reports dry skin of scalp, antecubital fossa and upper thighs.  Denies redness,  lesions or ulcercations.  Neurological: Pt reports insomnia, neuropathic pain. Denies dizziness, difficulty with memory, difficulty with speech or problems with balance and coordination.  Psych: Pt has a history of anxiety and depression. Denies SI/HI.  No other specific complaints in a complete review of systems (except as listed in HPI above).  Objective:   Physical Exam    BP 118/78 (BP Location: Right Arm, Patient Position: Sitting)   Pulse 84   Ht  (1.473 m)   Wt 152 lb (68.9 kg)   LMP 08/01/2022 (Approximate)   SpO2 97%   BMI 31.77 kg/m   Wt Readings from Last 3 Encounters:  07/22/22 150 lb (68 kg)  07/18/22 150 lb (68 kg)  06/29/22 150 lb (68 kg)    General: Appears her stated age, obese, in NAD. Skin: Warm, dry and intact. No rashes noted on the antecubital fossa's or scalp at this time. HEENT: Head: normal shape and size; Eyes: sclera white, no icterus, conjunctiva pink, PERRLA and EOMs intact;  Cardiovascular: Normal rate and rhythm.  Pulmonary/Chest: Normal effort and positive vesicular breath sounds. No respiratory distress. No wheezes, rales or ronchi noted.  Musculoskeletal: No difficulty with gait.  Neurological: Alert and oriented. Coordination normal.  Psychiatric: Mood and affect normal. Behavior is normal. Judgment and thought content normal.    BMET    Component Value Date/Time   NA 137 07/18/2022 1436   K 4.4 07/18/2022 1436   CL 104 07/18/2022 1436   CO2 23 07/18/2022 1436   GLUCOSE 85 07/18/2022 1436   BUN 8 07/18/2022 1436    CREATININE 0.74 07/18/2022 1436   CALCIUM 9.8 07/18/2022 1436   GFRNONAA NOT CALCULATED 02/02/2018 0039   GFRAA NOT CALCULATED 02/02/2018 0039    Lipid Panel     Component Value Date/Time   CHOL 181 06/02/2022 1339   TRIG 124 06/02/2022 1339   HDL 57 06/02/2022 1339   CHOLHDL 3.2 06/02/2022 1339   LDLCALC 101 (H) 06/02/2022 1339    CBC    Component Value Date/Time   WBC 10.6 07/18/2022 1436   RBC 4.51 07/18/2022 1436   HGB 13.3 07/18/2022 1436   HCT 39.0 07/18/2022 1436   PLT 312 07/18/2022 1436  MCV 86.5 07/18/2022 1436   MCH 29.5 07/18/2022 1436   MCHC 34.1 07/18/2022 1436   RDW 12.0 07/18/2022 1436   LYMPHSABS 1.8 02/02/2018 0039   MONOABS 1.7 (H) 02/02/2018 0039   EOSABS 0.1 02/02/2018 0039   BASOSABS 0.1 02/02/2018 0039    Hgb A1C Lab Results  Component Value Date   HGBA1C 4.9 11/25/2021          Assessment & Plan:   ER Follow Up for Migraine:  ER notes, labs and imaging reviewed Migraine has resolved as of this time She has an appointment with neurology in June for follow-up   RTC in 5 months for your annual exam [name], NP

## 2022-08-18 NOTE — Assessment & Plan Note (Signed)
Advised her to start a daily antihistamine Rx for clobetasol scalp solution 2 times weekly Rx for triamcinolone cream 0.1% twice daily as needed

## 2022-08-18 NOTE — Patient Instructions (Signed)
Eczema Eczema refers to a group of skin conditions that cause skin to become rough and inflamed. Each type of eczema has different triggers, symptoms, and treatments. Eczema of any type is usually itchy. Symptoms range from mild to severe. Eczema is not spread from person to person (is not contagious). It can appear on different parts of the body at different times. One person's eczema may look different from another person's eczema. What are the causes? The exact cause of this condition is not known. However, exposure to certain environmental factors, irritants, and allergens can make the condition worse. What are the signs or symptoms? Symptoms of this condition depend on the type of eczema you have. The types include: Contact dermatitis. There are two kinds: Irritant contact dermatitis. This happens when something irritates the skin and causes a rash. Allergic contact dermatitis. This happens when your skin comes in contact with something you are allergic to (allergens). This can include poison ivy, chemicals, or medicines that were applied to your skin. Atopic dermatitis. This is a long-term (chronic) skin disease that keeps coming back (recurring). It is the most common type of eczema. Usual symptoms are a red rash and itchy, dry, scaly skin. It usually starts showing signs in infancy and can last through adulthood. Dyshidrotic eczema. This is a form of eczema on the hands and feet. It shows up as very itchy, fluid-filled blisters. It can affect people of any age but is more common before age 40. Hand eczema. This causes very itchy areas of skin on the palms and sides of the hands and fingers. This type of eczema is common in industrial jobs where you may be exposed to different types of irritants. Lichen simplex chronicus. This type of eczema occurs when a person constantly scratches one area of the body. Repeated scratching of the area leads to thickened skin (lichenification). This condition can  accompany other types of eczema. It is more common in adults but may also be seen in children. Nummular eczema. This is a common type of eczema that most often affects the lower legs and the backs of the hands. It typically causes an itchy, red, circular, crusty lesion (plaque). Scratching may become a habit and can cause bleeding. Nummular eczema occurs most often in middle-aged or older people. Seborrheic dermatitis. This is a common skin disease that mainly affects the scalp. It may also affect other oily areas of the body, such as the face, sides of the nose, eyebrows, ears, eyelids, and chest. It is marked by small scaling and redness of the skin (erythema). This can affect people of all ages. In infants, this condition is called cradle cap. Stasis dermatitis. This is a common skin disease that can cause itching, scaling, and hyperpigmentation, usually on the legs and feet. It occurs most often in people who have a condition that prevents blood from being pumped through the veins in the legs (chronic venous insufficiency). Stasis dermatitis is a chronic condition that needs long-term management. How is this diagnosed? This condition may be diagnosed based on: A physical exam of your skin. Your medical history. Skin patch tests. These tests involve using patches that contain possible allergens and placing them on your back. Your health care provider will check in a few days to see if an allergic reaction occurred. How is this treated? Treatment for eczema is based on the type of eczema you have. You may be given hydrocortisone steroid medicine or antihistamines. These can relieve itching quickly and help reduce inflammation.   These may be prescribed or purchased over the counter, depending on the strength that is needed. Follow these instructions at home: Take or apply over-the-counter and prescription medicines only as told by your health care provider. Use creams or ointments to moisturize your  skin. Do not use lotions. Learn what triggers or irritates your symptoms so you can avoid these things. Treat symptom flare-ups quickly. Do not scratch your skin. This can make your rash worse. Keep all follow-up visits. This is important. Where to find more information American Academy of Dermatology: aad.org National Eczema Association: nationaleczema.org The Society for Pediatric Dermatology: pedsderm.net Contact a health care provider if: You have severe itching, even with treatment. You scratch your skin regularly until it bleeds. Your rash looks different than usual. Your skin is painful, swollen, or more red than usual. You have a fever. Summary Eczema refers to a group of skin conditions that cause skin to become rough and inflamed. Each type has different triggers. Eczema of any type causes itching that may range from mild to severe. Treatment varies based on the type of eczema you have. Hydrocortisone steroid medicine or antihistamines can help with itching and inflammation. Protecting your skin is the best way to prevent eczema. Use creams or ointments to moisturize your skin. Avoid triggers and irritants. Treat flare-ups quickly. This information is not intended to replace advice given to you by your health care provider. Make sure you discuss any questions you have with your health care provider. Document Revised: 01/20/2020 Document Reviewed: 01/20/2020 Elsevier Patient Education  2023 Elsevier Inc.  

## 2022-08-18 NOTE — Assessment & Plan Note (Signed)
Will discontinue Kapvay and trial Strattera Advised her if this did not work, the next step would be referral to psychiatry for medication management

## 2022-08-19 NOTE — Transitions of Care (Post Inpatient/ED Visit) (Signed)
   08/19/2022  Name: RENAY CRAMMER MRN: [id number] DOB: [birth year]  Today's TOC FU Call Status: Today's TOC FU Call Status:: Successful TOC FU Call Competed Unsuccessful Call (1st Attempt) Date: 08/17/22 [facility] FU Call Complete Date: 08/19/22  Transition Care Management Follow-up Telephone Call Date of Discharge: 08/16/22 Discharge Facility: Other ([facility]) Name of Other ([facility]) [facility]charge Facility: [facility] Type of Discharge: Emergency Department How have you been since you were released from the hospital?: Better Any questions or concerns?: No  Items Reviewed: Did you receive and understand the discharge instructions provided?: Yes Medications obtained and verified?: Yes (Medications Reviewed) Any new allergies since your discharge?: No Dietary orders reviewed?: NA Do you have support at home?: No  Home Care and Equipment/Supplies: Were Home Health Services Ordered?: NA Any new equipment or medical supplies ordered?: NA  Functional Questionnaire: Do you need assistance with bathing/showering or dressing?: No Do you need assistance with meal preparation?: No Do you need assistance with eating?: No Do you have difficulty maintaining continence: No Do you need assistance with getting out of bed/getting out of a chair/moving?: No Do you have difficulty managing or taking your medications?: No  Follow up appointments reviewed: PCP Follow-up appointment confirmed?: Yes Date of PCP follow-up appointment?: 08/18/22 Follow-up Provider: [name], NP [facility] Follow-up appointment confirmed?: NA Do you need transportation to your follow-up appointment?: No Do you understand care options if your condition(s) worsen?: Yes-patient verbalized understanding    Oneal Grout, [facility]) Lovie Macadamia [phone]

## 2022-08-25 NOTE — Addendum Note (Signed)
Addended by: Lorre Munroe on: 08/25/2022 08:42 AM   Modules accepted: Orders

## 2022-09-09 NOTE — Telephone Encounter (Signed)
Can you check on status of psychiatry referral?

## 2022-09-10 ENCOUNTER — Other Ambulatory Visit: Payer: Self-pay | Admitting: Internal Medicine

## 2022-09-12 NOTE — Telephone Encounter (Signed)
Requested medication (s) are due for refill today - yes  Requested medication (s) are on the active medication list -yes  Future visit scheduled -yes  Last refill: 08/18/22 #30   Notes to clinic: non delegated Rx  Requested Prescriptions  Pending Prescriptions Disp Refills   atomoxetine (STRATTERA) 10 MG capsule [Pharmacy Med Name: ATOMOXETINE HCL 10 MG CAPSULE] 90 capsule 1    Sig: TAKE 1 CAPSULE BY MOUTH EVERY DAY     Not Delegated - Psychiatry: Norepinephrine Reuptake Inhibitor Failed - 09/10/2022  2:33 PM      Failed - This refill cannot be delegated      Passed - Completed PHQ-2 or PHQ-9 in the last 360 days      Passed - Last BP in normal range    BP Readings from Last 1 Encounters:  08/18/22 118/78         Passed - Last Heart Rate in normal range    Pulse Readings from Last 1 Encounters:  08/18/22 84         Passed - Valid encounter within last 6 months    Recent Outpatient Visits           3 weeks ago Attention deficit disorder (ADD) without hyperactivity   [place] [facility] [place], Salvadore Oxford, NP   1 month ago Rash of face   [place] [facility] [place], Salvadore Oxford, NP   1 month ago Generalized abdominal pain   [place] [facility] [place], Salvadore Oxford, NP   2 months ago Dysuria   [place] [facility] [place], Salvadore Oxford, NP   2 months ago Dysuria   [place] [facility] [place], Salvadore Oxford, NP       Future Appointments             In 3 months Baity, Salvadore Oxford, NP [facility], [facility]               Requested Prescriptions  Pending Prescriptions Disp Refills   atomoxetine (STRATTERA) 10 MG capsule [Pharmacy Med Name: ATOMOXETINE HCL 10 MG CAPSULE] 90 capsule 1    Sig: TAKE 1 CAPSULE BY MOUTH EVERY DAY     Not Delegated - Psychiatry: Norepinephrine Reuptake Inhibitor Failed - 09/10/2022  2:33 PM      Failed - This refill cannot be delegated       Passed - Completed PHQ-2 or PHQ-9 in the last 360 days      Passed - Last BP in normal range    BP Readings from Last 1 Encounters:  08/18/22 118/78         Passed - Last Heart Rate in normal range    Pulse Readings from Last 1 Encounters:  08/18/22 84         Passed - Valid encounter within last 6 months    Recent Outpatient Visits           3 weeks ago Attention deficit disorder (ADD) without hyperactivity   [place] [facility] [place], Salvadore Oxford, NP   1 month ago Rash of face   [place] [facility] [place], Salvadore Oxford, NP   1 month ago Generalized abdominal pain    [facility] [place], Salvadore Oxford, NP   2 months ago Dysuria   [place] [facility] [place], Salvadore Oxford, NP   2 months ago  Dysuria   [place] [facility] [place], Salvadore Oxford, NP       Future Appointments             In 3 months Baity, Salvadore Oxford, NP [facility], [place]

## 2022-10-10 ENCOUNTER — Other Ambulatory Visit: Payer: Self-pay | Admitting: Internal Medicine

## 2022-10-10 NOTE — Telephone Encounter (Signed)
Requested medications are due for refill today.  yes  Requested medications are on the active medications list.  yes  Last refill. 09/14/2021 #30 0 rf   Future visit scheduled.   yes  Notes to clinic.  Refill not delegated.    Requested Prescriptions  Pending Prescriptions Disp Refills   atomoxetine (STRATTERA) 10 MG capsule [Pharmacy Med Name: ATOMOXETINE HCL 10 MG CAPSULE] 90 capsule 1    Sig: TAKE 1 CAPSULE BY MOUTH EVERY DAY     Not Delegated - Psychiatry: Norepinephrine Reuptake Inhibitor Failed - 10/10/2022  2:46 AM      Failed - This refill cannot be delegated      Passed - Completed PHQ-2 or PHQ-9 in the last 360 days      Passed - Last BP in normal range    BP Readings from Last 1 Encounters:  08/18/22 118/78         Passed - Last Heart Rate in normal range    Pulse Readings from Last 1 Encounters:  08/18/22 84         Passed - Valid encounter within last 6 months    Recent Outpatient Visits           1 month ago Attention deficit disorder (ADD) without hyperactivity   [place] [facility] [place], Salvadore Oxford, NP   2 months ago Rash of face   [place] [facility] [place], Salvadore Oxford, NP   2 months ago Generalized abdominal pain   [place] [facility] [place], Salvadore Oxford, NP   3 months ago Dysuria   [place] [facility] [place], Salvadore Oxford, NP   3 months ago Dysuria   [place] [facility] [place], Salvadore Oxford, NP       Future Appointments             In 2 months Baity, Salvadore Oxford, NP  [facility], [facility]

## 2022-10-17 ENCOUNTER — Encounter: Payer: Self-pay | Admitting: Internal Medicine

## 2022-11-07 ENCOUNTER — Other Ambulatory Visit: Payer: Self-pay | Admitting: Gastroenterology

## 2022-11-10 ENCOUNTER — Other Ambulatory Visit: Payer: Self-pay | Admitting: Internal Medicine

## 2022-11-10 DIAGNOSIS — E7800 Pure hypercholesterolemia, unspecified: Secondary | ICD-10-CM

## 2022-11-11 ENCOUNTER — Other Ambulatory Visit: Payer: Self-pay | Admitting: Internal Medicine

## 2022-11-11 NOTE — Telephone Encounter (Signed)
Requested medication (s) are due for refill today: routing for review  Requested medication (s) are on the active medication list: yes  Last refill:  08/18/22  Future visit scheduled: yes  Notes to clinic:  Unable to refill per protocol, cannot delegate.      Requested Prescriptions  Pending Prescriptions Disp Refills   clobetasol (TEMOVATE) 0.05 % external solution [Pharmacy Med Name: CLOBETASOL 0.05% SOLUTION] 50 mL 0    Sig: Apply 1 Application topically 2 (two) times a week.     Not Delegated - Dermatology:  Corticosteroids Failed - 11/11/2022  8:27 AM      Failed - This refill cannot be delegated      Passed - Valid encounter within last 12 months    Recent Outpatient Visits           2 months ago Attention deficit disorder (ADD) without hyperactivity   [place] [facility] [place], Salvadore Oxford, NP   3 months ago Rash of face   [place] [facility] [place], Salvadore Oxford, NP   3 months ago Generalized abdominal pain   [place] [facility] [place], Salvadore Oxford, NP   4 months ago Dysuria   [place] [facility] [place], Salvadore Oxford, NP   4 months ago Dysuria   [place] [facility] [place], Salvadore Oxford, NP       Future Appointments             In 1 month Baity, Salvadore Oxford, NP [place] [facility], [facility]

## 2022-11-11 NOTE — Telephone Encounter (Signed)
Requested Prescriptions  Pending Prescriptions Disp Refills   Norgestimate-Ethinyl Estradiol Triphasic (TRI-ESTARYLLA) 0.18/0.215/0.25 MG-35 MCG tablet [Pharmacy Med Name: TRI-ESTARYLLA TABLET] 84 tablet 3    Sig: TAKE 1 TABLET BY MOUTH EVERY DAY     OB/GYN:  Contraceptives Passed - 11/10/2022  5:55 PM      Passed - Last BP in normal range    BP Readings from Last 1 Encounters:  08/18/22 118/78         Passed - Valid encounter within last 12 months    Recent Outpatient Visits           2 months ago Attention deficit disorder (ADD) without hyperactivity   [place] [facility] [place], Salvadore Oxford, NP   3 months ago Rash of face   [place] [facility] [place], Salvadore Oxford, NP   3 months ago Generalized abdominal pain   [place] [facility] [place], Salvadore Oxford, NP   4 months ago Dysuria   [place] [facility] [place], Salvadore Oxford, NP   4 months ago Dysuria    [place] [facility] [place], Salvadore Oxford, NP       Future Appointments             In 1 month Baity, Salvadore Oxford, NP  [facility], [facility] [place]            Passed - Patient is not a smoker

## 2022-11-12 ENCOUNTER — Encounter: Payer: Self-pay | Admitting: Internal Medicine

## 2022-11-12 ENCOUNTER — Other Ambulatory Visit: Payer: Self-pay | Admitting: Internal Medicine

## 2022-11-12 DIAGNOSIS — E7800 Pure hypercholesterolemia, unspecified: Secondary | ICD-10-CM

## 2022-11-14 MED ORDER — NORGESTIM-ETH ESTRAD TRIPHASIC 0.18/0.215/0.25 MG-35 MCG PO TABS: 1.0000 | ORAL_TABLET | Freq: Every day | ORAL | 3 refills | Status: DC

## 2022-12-29 ENCOUNTER — Encounter: Payer: No Typology Code available for payment source | Admitting: Internal Medicine

## 2023-01-11 ENCOUNTER — Ambulatory Visit: Payer: No Typology Code available for payment source | Admitting: Internal Medicine

## 2023-01-16 ENCOUNTER — Ambulatory Visit (HOSPITAL_COMMUNITY)
Admission: EM | Admit: 2023-01-16 | Discharge: 2023-01-16 | Disposition: A | Discharge status: Home / Self Care | Payer: No Typology Code available for payment source | Attending: Family | Admitting: Family

## 2023-01-16 ENCOUNTER — Ambulatory Visit: Payer: Self-pay

## 2023-01-16 DIAGNOSIS — Z1152 Encounter for screening for COVID-19: Secondary | ICD-10-CM

## 2023-01-16 DIAGNOSIS — R509 Fever, unspecified: Secondary | ICD-10-CM | POA: Insufficient documentation

## 2023-01-16 DIAGNOSIS — R52 Pain, unspecified: Secondary | ICD-10-CM | POA: Insufficient documentation

## 2023-01-16 DIAGNOSIS — G4700 Insomnia, unspecified: Secondary | ICD-10-CM | POA: Diagnosis not present

## 2023-01-16 DIAGNOSIS — J029 Acute pharyngitis, unspecified: Secondary | ICD-10-CM | POA: Diagnosis not present

## 2023-01-16 DIAGNOSIS — R0602 Shortness of breath: Secondary | ICD-10-CM | POA: Insufficient documentation

## 2023-01-16 DIAGNOSIS — F909 Attention-deficit hyperactivity disorder, unspecified type: Secondary | ICD-10-CM | POA: Insufficient documentation

## 2023-01-16 DIAGNOSIS — R059 Cough, unspecified: Secondary | ICD-10-CM | POA: Diagnosis present

## 2023-01-16 DIAGNOSIS — R051 Acute cough: Secondary | ICD-10-CM

## 2023-01-16 DIAGNOSIS — K209 Esophagitis, unspecified without bleeding: Secondary | ICD-10-CM | POA: Diagnosis not present

## 2023-01-16 DIAGNOSIS — Z79899 Other long term (current) drug therapy: Secondary | ICD-10-CM | POA: Insufficient documentation

## 2023-01-16 LAB — POCT INFLUENZA A/B
Influenza A, POC: NEGATIVE
Influenza B, POC: NEGATIVE

## 2023-01-16 MED ORDER — BENZONATATE 200 MG PO CAPS: 200.0000 mg | ORAL_CAPSULE | Freq: Three times a day (TID) | ORAL | 0 refills | Status: DC | PRN

## 2023-01-16 NOTE — ED Triage Notes (Signed)
Cough, Sore Throat, Fever, sob, chest congestion x2d

## 2023-01-16 NOTE — Discharge Instructions (Signed)
Recommend continue OTC Ibuprofen 600mg  every 8 hours as needed for body aches/headache. May start Tessalon cough pills 200mg  every 8 hours as needed. May continue OTC Sudafed 60mg  every 6 to 8 hours as needed for congestion. Rest. Continue to push fluids. May also take OTC Delsym 2 teaspoons every 12 hours as needed for cough. Follow-up pending COVID test results and in 3 to 4 days if not improving or sooner if worsening.

## 2023-01-16 NOTE — ED Provider Notes (Signed)
[facility]    CSN: [id number] Arrival date & time: 01/16/23  0946      History   Chief Complaint Chief Complaint  Patient presents with   Cough   Shortness of Breath   Fever   Sore Throat    HPI Michele Dunlap is a 22 y.o. female.   22 year old female presents with runny nose, headache, body aches that started 2 to 3 days ago. Got worse yesterday with more severe cough which caused her to vomit. Also having low grade fever (100.3) sore throat and nausea. Denies any diarrhea. Has taken OTC Ibuprofen, Benadryl and Sudafed with minimal relief. 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 No history of asthma or chronic lung disorder. Other chronic health issues include ADHD in which she takes Adderall daily, migraine headaches and insomnia in which she is on Topamax and Effexor daily and Maxalt prn. She also has esophagitis in which she takes Prilosec daily.   The history is provided by the patient.    Past Medical History:  Diagnosis Date   Allergy    Complication of anesthesia    Started to wake during EGD (age 61)   Migraine    couple times per week    Patient Active Problem List   Diagnosis Date Noted   ADD (attention deficit disorder) 08/18/2022   Eczema 08/18/2022   Idiopathic neuropathy 06/02/2022   Pure hypercholesterolemia 06/02/2022   Insomnia 01/10/2022   Overweight with body mass index (BMI) of 29 to 29.9 in adult 11/25/2021   Anxiety and depression 03/18/2020   Eosinophilic esophagitis 02/12/2018   Migraine 02/27/2012    Past Surgical History:  Procedure Laterality Date   ESOPHAGOGASTRODUODENOSCOPY  03/15/2018   [facility]   ESOPHAGOGASTRODUODENOSCOPY (EGD) WITH PROPOFOL N/A 04/29/2022   Procedure: ESOPHAGOGASTRODUODENOSCOPY (EGD) WITH BIOPSY AND DILATION;  Surgeon: Midge Minium, MD;  Location: [facility];  Service: Endoscopy;  Laterality: N/A;  DILATION 12-15MM   ESOPHAGOGASTRODUODENOSCOPY (EGD) WITH  PROPOFOL N/A 06/16/2022   Procedure: ESOPHAGOGASTRODUODENOSCOPY (EGD) WITH PROPOFOL;  Surgeon: Midge Minium, MD;  Location: [facility];  Service: Endoscopy;  Laterality: N/A;   IR FL GUIDED LOC OF NEEDLE/CATH TIP FOR SPINAL INJECTION LT  03/31/2022    OB History   No obstetric history on file.      Home Medications    Prior to Admission medications   Medication Sig Start Date End Date Taking? Authorizing Provider  amphetamine-dextroamphetamine (ADDERALL) 10 MG tablet Take 10 mg by mouth daily with breakfast.   Yes [provider]  benzonatate (TESSALON) 200 MG capsule Take 1 capsule (200 mg total) by mouth every 8 (eight) hours as needed for cough. 01/16/23  Yes Neeraj Housand, Ali Lowe, NP  ethynodiol-ethinyl estradiol (ZOVIA 1/35E, 28,) 1-35 MG-MCG tablet 1 tablet Orally Once a day for 28 day(s) 08/26/19  Yes [provider]  omeprazole (PRILOSEC) 40 MG capsule TAKE 1 CAPSULE (40 MG TOTAL) BY MOUTH DAILY. 11/08/22  Yes Midge Minium, MD  rizatriptan (MAXALT) 10 MG tablet Take by mouth. 10/04/22  Yes [provider]  topiramate (TOPAMAX) 50 MG tablet Take by mouth. 10/28/22  Yes [provider]  clobetasol (TEMOVATE) 0.05 % external solution APPLY 1 APPLICATION TOPICALLY 2 (TWO) TIMES A WEEK. 11/14/22   Lorre Munroe, NP  triamcinolone cream (KENALOG) 0.1 % Apply 1 Application topically 2 (two) times daily. 08/18/22   Lorre Munroe, NP  venlafaxine (EFFEXOR) 37.5 MG tablet  Take 75 mg by mouth daily.    [provider]    Family History Family History  Problem Relation Age of Onset   Hypertension Mother    Healthy Father    Down syndrome Sister    Hypertension Maternal Grandmother    Hypertension Maternal Grandfather    Hypertension Paternal Grandmother    Hypertension Paternal Grandfather     Social History Social History   Tobacco Use   Smoking status: Never   Smokeless tobacco: Never  Vaping Use   Vaping status: Never Used  Substance  Use Topics   Alcohol use: Never   Drug use: Never     Allergies   Iodinated contrast media and Multihance [gadobenate]   Review of Systems Review of Systems  Constitutional:  Positive for activity change, appetite change, chills, fatigue and fever. Negative for diaphoresis.  HENT:  Positive for congestion, postnasal drip, rhinorrhea, sinus pressure, sore throat and voice change. Negative for ear discharge, facial swelling, mouth sores, sinus pain and trouble swallowing.   Eyes:  Negative for pain, discharge, redness and itching.  Respiratory:  Positive for cough, chest tightness and shortness of breath. Negative for wheezing.   Gastrointestinal:  Positive for nausea and vomiting (due to cough). Negative for diarrhea.  Genitourinary:  Negative for decreased urine volume and difficulty urinating.  Musculoskeletal:  Positive for arthralgias and myalgias. Negative for neck pain and neck stiffness.  Skin:  Negative for color change and rash.  Allergic/Immunologic: Negative for environmental allergies, food allergies and immunocompromised state.  Neurological:  Positive for headaches. Negative for dizziness, tremors, seizures, syncope, speech difficulty and numbness.  Hematological:  Negative for adenopathy. Does not bruise/bleed easily.  Psychiatric/Behavioral:  Positive for sleep disturbance.      Physical Exam Triage Vital Signs ED Triage Vitals  Encounter Vitals Group     BP 01/16/23 1041 133/85     Systolic BP Percentile --      Diastolic BP Percentile --      Pulse Rate 01/16/23 1041 (!) 105     Resp 01/16/23 1041 19     Temp 01/16/23 1041 98.8 F (37.1 C)     Temp Source 01/16/23 1041 Oral     SpO2 01/16/23 1041 98 %     Weight 01/16/23 1040 145 lb (65.8 kg)     Height --      Head Circumference --      Peak Flow --      Pain Score 01/16/23 1039 3     Pain Loc --      Pain Education --      Exclude from Growth Chart --    No data found.  Updated Vital Signs BP  133/85 (BP Location: Left Arm)   Pulse (!) 105   Temp 98.8 F (37.1 C) (Oral)   Resp 19   Wt 145 lb (65.8 kg)   SpO2 98%   BMI 30.31 kg/m   Visual Acuity Right Eye Distance:   Left Eye Distance:   Bilateral Distance:    Right Eye Near:   Left Eye Near:    Bilateral Near:     Physical Exam Vitals and nursing note reviewed.  Constitutional:      General: She is awake. She is not in acute distress.    Appearance: She is well-developed and well-groomed. She is ill-appearing.     Comments: She is sitting on the exam chair in no acute distress but appears tired and ill.   HENT:  Head: Normocephalic and atraumatic.     Right Ear: Hearing, ear canal and external ear normal. Tympanic membrane is bulging. Tympanic membrane is not injected or erythematous.     Left Ear: Hearing, ear canal and external ear normal. Tympanic membrane is bulging. Tympanic membrane is not injected or erythematous.     Nose: Congestion present.     Right Sinus: No maxillary sinus tenderness or frontal sinus tenderness.     Left Sinus: No maxillary sinus tenderness or frontal sinus tenderness.     Mouth/Throat:     Lips: Pink.     Mouth: Mucous membranes are moist.     Pharynx: Uvula midline. Posterior oropharyngeal erythema and postnasal drip present. No pharyngeal swelling, oropharyngeal exudate or uvula swelling.  Eyes:     Extraocular Movements: Extraocular movements intact.     Conjunctiva/sclera: Conjunctivae normal.  Cardiovascular:     Rate and Rhythm: Regular rhythm. Tachycardia present.     Heart sounds: Normal heart sounds. No murmur heard. Pulmonary:     Effort: Pulmonary effort is normal. No accessory muscle usage, respiratory distress or retractions.     Breath sounds: Normal breath sounds and air entry. No decreased air movement. No decreased breath sounds, wheezing, rhonchi or rales.  Musculoskeletal:     Cervical back: Normal range of motion and neck supple.  Lymphadenopathy:      Cervical: No cervical adenopathy.  Skin:    General: Skin is warm and dry.     Capillary Refill: Capillary refill takes less than 2 seconds.     Findings: No rash.  Neurological:     General: No focal deficit present.     Mental Status: She is alert and oriented to person, place, and time.  Psychiatric:        Mood and Affect: Mood normal.        Behavior: Behavior normal. Behavior is cooperative.        Thought Content: Thought content normal.        Judgment: Judgment normal.      UC Treatments / Results  Labs (all labs ordered are listed, but only abnormal results are displayed) Labs Reviewed  SARS CORONAVIRUS 2 (TAT 6-24 HRS)  POCT INFLUENZA A/B    EKG   Radiology No results found.  Procedures Procedures (including critical care time)  Medications Ordered in UC Medications - No data to display  Initial Impression / Assessment and Plan / UC Course  I have reviewed the triage vital signs and the nursing notes.  Pertinent labs & imaging results that were available during my care of the patient were reviewed by me and considered in my medical decision making (see chart for details).     Due to acute onset of symptoms and tachycardia with low grade fever, will test for Influenza. Rapid test for A & B was negative. Will obtain sample for COVID testing. Discussed with patient that she probably has a viral respiratory illness. Recommend continue OTC Ibuprofen 600mg  every 8 hours as needed for body aches, headache and any fever. May start Tessalon cough pills 200mg  every 8 hours as needed. May also start OTC Delsym cough syrup 2 teaspoons every 12 hours as needed if Tessalon perles are not helpful. May continue OTC Sudafed 60mg  every 6 to 8 hours as needed for congestion. Rest. Continue to push fluids. Note written for school/college and work. Discussed even if COVID test is positive, will treat symptomatically- no antivirals indicated. May extend time out of class/work if  COVID test is positive. Recommend follow-up pending COVID test results and in 3 to 4 days if not improving or sooner if worsening.   Final Clinical Impressions(s) / UC Diagnoses   Final diagnoses:  Sore throat  Acute cough  Encounter for screening for COVID-19  Generalized body aches     Discharge Instructions      Recommend continue OTC Ibuprofen 600mg  every 8 hours as needed for body aches/headache. May start Tessalon cough pills 200mg  every 8 hours as needed. May continue OTC Sudafed 60mg  every 6 to 8 hours as needed for congestion. Rest. Continue to push fluids. May also take OTC Delsym 2 teaspoons every 12 hours as needed for cough. Follow-up pending COVID test results and in 3 to 4 days if not improving or sooner if worsening.      ED Prescriptions     Medication Sig Dispense Auth. Provider   benzonatate (TESSALON) 200 MG capsule Take 1 capsule (200 mg total) by mouth every 8 (eight) hours as needed for cough. 15 capsule Yani Coventry, Ali Lowe, NP      PDMP not reviewed this encounter.   Sudie Grumbling, NP 01/16/23 [phone]

## 2023-01-16 NOTE — Telephone Encounter (Signed)
Chief Complaint: Cough Symptoms: Cough, sore throat, fever, vomiting, nausea,  Frequency: constant Pertinent Negatives: Patient denies chest pain  Disposition: [] ED /[x] Urgent Care (no appt availability in office) / [] Appointment(In office/virtual)/ []  [facility]/ [] Home Care/ [] Refused Recommended Disposition /[] [place] [facility]/ []  Follow-up with PCP Additional Notes: Patient stated she started feeling sick 2 days ago but through it was seasonal allergies. Patient reports a moderate to severe productive cough that has caused her to vomiting after a coughing spell. Patient also reports a sore throat. 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 No appointments available in the office today. Patient stated she will go to urgent care as a walk-in, declined an appointment at urgent care.  Reason for Disposition  [1] Continuous (nonstop) coughing interferes with work or school AND [2] no improvement using cough treatment per Care Advice  Answer Assessment - Initial Assessment Questions 1. ONSET: "When did the cough begin?"      2 days ago 2. SEVERITY: "How bad is the cough today?"      Moderate to Severe 3. SPUTUM: "Describe the color of your sputum" (none, dry cough; clear, white, yellow, green)     I'm not of the color but I am coughing up something 4. HEMOPTYSIS: "Are you coughing up any blood?" If so ask: "How much?" (flecks, streaks, tablespoons, etc.)     No 5. DIFFICULTY BREATHING: "Are you having difficulty breathing?" If Yes, ask: "How bad is it?" (e.g., mild, moderate, severe)    - MILD: No SOB at rest, mild SOB with walking, speaks normally in sentences, can lie down, no retractions, pulse < 100.    - MODERATE: SOB at rest, SOB with minimal exertion and prefers to sit, cannot lie down flat, speaks in phrases, mild retractions, audible wheezing, pulse 100-120.    - SEVERE: Very  SOB at rest, speaks in single words, struggling to breathe, sitting hunched forward, retractions, pulse > 120      moderate 6. FEVER: "Do you have a fever?" If Yes, ask: "What is your temperature, how was it measured, and when did it start?"     I had one last night but I didn't check today. 100.3  7. CARDIAC HISTORY: "Do you have any history of heart disease?" (e.g., heart attack, congestive heart failure)      No 8. LUNG HISTORY: "Do you have any history of lung disease?"  (e.g., pulmonary embolus, asthma, emphysema)     No 9. PE RISK FACTORS: "Do you have a history of blood clots?" (or: recent major surgery, recent prolonged travel, bedridden)     No 10. OTHER SYMPTOMS: "Do you have any other symptoms?" (e.g., runny nose, wheezing, chest pain)       Vomiting, sore throat, headache, nausea, ear fullness,   12. TRAVEL: "Have you traveled out of the country in the last month?" (e.g., travel history, exposures)       No  Protocols used: Cough - Acute Productive-A-AH

## 2023-01-16 NOTE — Telephone Encounter (Signed)
Noted.  I could work her in at 1:00 today if she would rather do that.

## 2023-01-17 LAB — SARS CORONAVIRUS 2 (TAT 6-24 HRS): SARS Coronavirus 2: NEGATIVE

## 2023-01-24 ENCOUNTER — Telehealth (INDEPENDENT_AMBULATORY_CARE_PROVIDER_SITE_OTHER): Payer: No Typology Code available for payment source | Admitting: Internal Medicine

## 2023-01-24 ENCOUNTER — Encounter: Payer: Self-pay | Admitting: Internal Medicine

## 2023-01-24 DIAGNOSIS — J00 Acute nasopharyngitis [common cold]: Secondary | ICD-10-CM

## 2023-01-24 DIAGNOSIS — J069 Acute upper respiratory infection, unspecified: Secondary | ICD-10-CM | POA: Diagnosis not present

## 2023-01-24 MED ORDER — PREDNISONE 10 MG PO TABS: ORAL_TABLET | ORAL | 0 refills | Status: DC

## 2023-01-24 MED ORDER — AZITHROMYCIN 250 MG PO TABS
ORAL_TABLET | ORAL | 0 refills | Status: DC
Start: 2023-01-24 — End: 2023-02-03

## 2023-01-24 NOTE — Progress Notes (Signed)
Virtual Visit via Video Note  I connected with Michele Dunlap on 01/24/23 at 11:20 AM EDT by a video enabled telemedicine application and verified that I am speaking with the correct person using two identifiers.  Location: Patient: In her car Provider: Office  Persons participating in this video call: [name], NP and [place]   I discussed the limitations of evaluation and management by telemedicine and the availability of in person appointments. The patient expressed understanding and agreed to proceed.  History of Present Illness:  Patient reports headache, runny nose, nasal congestion, ear pain, sore throat, cough, shortness of breath, nausea, vomiting, and diarrhea. She reports this started 10 days ago. She is blowing yellow/green mucous out of her nose. She denies painful swallowing. The cough is productive of yellow mucous. She denies fever but has had chills and body aches.  She was seen in urgent care 9/23 for the same.  Flu and COVID testing were negative.  She was diagnosed with a viral respiratory illness and prescribed tessalon.  She was advised to also use delsym and sudafed OTC as needed. She reports she is not feeling any better. She has had sick contacts with minimal relief of symptoms.     Past Medical History:  Diagnosis Date   Allergy    Complication of anesthesia    Started to wake during EGD (age 22)   Migraine    couple times per week    Current Outpatient Medications  Medication Sig Dispense Refill   amphetamine-dextroamphetamine (ADDERALL) 10 MG tablet Take 10 mg by mouth daily with breakfast.     benzonatate (TESSALON) 200 MG capsule Take 1 capsule (200 mg total) by mouth every 8 (eight) hours as needed for cough. 15 capsule 0   clobetasol (TEMOVATE) 0.05 % external solution APPLY 1 APPLICATION TOPICALLY 2 (TWO) TIMES A WEEK. 50 mL 0   ethynodiol-ethinyl estradiol (ZOVIA 1/35E, 28,) 1-35 MG-MCG tablet 1 tablet Orally Once a day for 28 day(s)      omeprazole (PRILOSEC) 40 MG capsule TAKE 1 CAPSULE (40 MG TOTAL) BY MOUTH DAILY. 90 capsule 0   rizatriptan (MAXALT) 10 MG tablet Take by mouth.     topiramate (TOPAMAX) 50 MG tablet Take by mouth.     triamcinolone cream (KENALOG) 0.1 % Apply 1 Application topically 2 (two) times daily. 30 g 2   venlafaxine (EFFEXOR) 37.5 MG tablet Take 75 mg by mouth daily.     No current facility-administered medications for this visit.    Allergies  Allergen Reactions   Iodinated Contrast Media Nausea Only   Multihance [Gadobenate] Nausea And Vomiting    Family History  Problem Relation Age of Onset   Hypertension Mother    Healthy Father    Down syndrome Sister    Hypertension Maternal Grandmother    Hypertension Maternal Grandfather    Hypertension Paternal Grandmother    Hypertension Paternal Grandfather     Social History   Socioeconomic History   Marital status: Single    Spouse name: Not on file   Number of children: Not on file   Years of education: Not on file   Highest education level: Not on file  Occupational History   Not on file  Tobacco Use   Smoking status: Never   Smokeless tobacco: Never  Vaping Use   Vaping status: Never Used  Substance and Sexual Activity   Alcohol use: Never   Drug use: Never   Sexual activity: Yes  Other Topics Concern  Not on file  Social History Narrative   Daysi is currently in 11th grade at [employer]. She lives at home with mother and sister. She has 3 dogs. She likes to dance.   Social Determinants of Health   Financial Resource Strain: Not on file  Food Insecurity: Not on file  Transportation Needs: Not on file  Physical Activity: Not on file  Stress: Not on file  Social Connections: Not on file  Intimate Partner Violence: Not on file     Constitutional: Pt reports fatigue, headache. Denies fever, malaise, or abrupt weight changes.  HEENT: Pt reports runny nose, nasal congestion, ear pain, and sore throat. Denies eye  pain, eye redness, ringing in the ears, wax buildup, bloody nose. Respiratory:Pt reports cough and shortness of breath. Denies difficulty breathing.   Cardiovascular: Denies chest pain, chest tightness, palpitations or swelling in the hands or feet.  Gastrointestinal: Pt reports nausea, vomiting or diarrhea. Denies abdominal pain, bloating, constipation, or blood in the stool.   No other specific complaints in a complete review of systems (except as listed in HPI above).  Observations/Objective:  Wt Readings from Last 3 Encounters:  01/16/23 145 lb (65.8 kg)  08/18/22 152 lb (68.9 kg)  07/22/22 150 lb (68 kg)    General: Appears her stated age, appears unwell but in NAD.  HEENT: Nose: Congestion noted; Throat/Mouth: Hoarseness noted Pulmonary/Chest: Normal effort.  Dry cough noted.  No respiratory distress.  Neurological: Alert and oriented.   BMET    Component Value Date/Time   NA 137 07/18/2022 1436   K 4.4 07/18/2022 1436   CL 104 07/18/2022 1436   CO2 23 07/18/2022 1436   GLUCOSE 85 07/18/2022 1436   BUN 8 07/18/2022 1436   CREATININE 0.74 07/18/2022 1436   CALCIUM 9.8 07/18/2022 1436   GFRNONAA NOT CALCULATED 02/02/2018 0039   GFRAA NOT CALCULATED 02/02/2018 0039    Lipid Panel     Component Value Date/Time   CHOL 181 06/02/2022 1339   TRIG 124 06/02/2022 1339   HDL 57 06/02/2022 1339   CHOLHDL 3.2 06/02/2022 1339   LDLCALC 101 (H) 06/02/2022 1339    CBC    Component Value Date/Time   WBC 10.6 07/18/2022 1436   RBC 4.51 07/18/2022 1436   HGB 13.3 07/18/2022 1436   HCT 39.0 07/18/2022 1436   PLT 312 07/18/2022 1436   MCV 86.5 07/18/2022 1436   MCH 29.5 07/18/2022 1436   MCHC 34.1 07/18/2022 1436   RDW 12.0 07/18/2022 1436   LYMPHSABS 1.8 02/02/2018 0039   MONOABS 1.7 (H) 02/02/2018 0039   EOSABS 0.1 02/02/2018 0039   BASOSABS 0.1 02/02/2018 0039    Hgb A1C Lab Results  Component Value Date   HGBA1C 4.9 11/25/2021        Assessment and  Plan:  Urgent care follow-up for viral respiratory illness:  Urgent care notes and labs reviewed Exam now consistent with a upper respiratory infection Encourage rest and fluids Rx for azithromycin 250 mg x 5 days Rx for Pred taper x 6 days Continue cough suppressants as previously prescribed  Keep your appointment in 1 month for your annual exam  Follow Up Instructions:    I discussed the assessment and treatment plan with the patient. The patient was provided an opportunity to ask questions and all were answered. The patient agreed with the plan and demonstrated an understanding of the instructions.   The patient was advised to call back or seek an in-person evaluation if  the symptoms worsen or if the condition fails to improve as anticipated.    [name], NP

## 2023-01-24 NOTE — Patient Instructions (Signed)

## 2023-02-03 ENCOUNTER — Other Ambulatory Visit (HOSPITAL_COMMUNITY)
Admission: RE | Admit: 2023-02-03 | Discharge: 2023-02-03 | Disposition: A | Discharge status: Home / Self Care | Payer: No Typology Code available for payment source | Source: Ambulatory Visit | Attending: Internal Medicine | Admitting: Internal Medicine

## 2023-02-03 ENCOUNTER — Ambulatory Visit (INDEPENDENT_AMBULATORY_CARE_PROVIDER_SITE_OTHER): Payer: No Typology Code available for payment source | Admitting: Internal Medicine

## 2023-02-03 ENCOUNTER — Encounter: Payer: Self-pay | Admitting: Internal Medicine

## 2023-02-03 VITALS — BP 136/82 | HR 95 | Temp 96.6°F | Ht <= 58 in | Wt 147.0 lb

## 2023-02-03 DIAGNOSIS — Z124 Encounter for screening for malignant neoplasm of cervix: Secondary | ICD-10-CM | POA: Insufficient documentation

## 2023-02-03 DIAGNOSIS — Z0001 Encounter for general adult medical examination with abnormal findings: Secondary | ICD-10-CM

## 2023-02-03 DIAGNOSIS — Z6830 Body mass index (BMI) 30.0-30.9, adult: Secondary | ICD-10-CM

## 2023-02-03 DIAGNOSIS — E7800 Pure hypercholesterolemia, unspecified: Secondary | ICD-10-CM | POA: Diagnosis not present

## 2023-02-03 DIAGNOSIS — E66811 Obesity, class 1: Secondary | ICD-10-CM

## 2023-02-03 DIAGNOSIS — Z23 Encounter for immunization: Secondary | ICD-10-CM | POA: Diagnosis not present

## 2023-02-03 DIAGNOSIS — Z113 Encounter for screening for infections with a predominantly sexual mode of transmission: Secondary | ICD-10-CM

## 2023-02-03 DIAGNOSIS — E6609 Other obesity due to excess calories: Secondary | ICD-10-CM

## 2023-02-03 DIAGNOSIS — R739 Hyperglycemia, unspecified: Secondary | ICD-10-CM

## 2023-02-03 NOTE — Patient Instructions (Signed)

## 2023-02-03 NOTE — Progress Notes (Signed)
Subjective:    Patient ID: Michele Dunlap, female    DOB: Nov 14, 2000, 22 y.o.   MRN: [id number]  HPI  Patient presents to clinic today for her annual exam.  Flu: 01/2023 Tetanus: 08/2012 COVID: x 1 Pap smear: Never Dentist: biannually  Diet: She does eat meat. She consumes fruits and veggies. She does eat fried foods. She drinks mostly water, soda, energy drinks. Exercise: Walking  Review of Systems  Past Medical History:  Diagnosis Date  . Allergy   . Complication of anesthesia    Started to wake during EGD (age 45)  . Migraine    couple times per week    Current Outpatient Medications  Medication Sig Dispense Refill  . amphetamine-dextroamphetamine (ADDERALL) 10 MG tablet Take 10 mg by mouth daily with breakfast.    . azithromycin (ZITHROMAX) 250 MG tablet Take 2 tabs today, then 1 tab daily x 4 days 6 tablet 0  . benzonatate (TESSALON) 200 MG capsule Take 1 capsule (200 mg total) by mouth every 8 (eight) hours as needed for cough. 15 capsule 0  . clobetasol (TEMOVATE) 0.05 % external solution APPLY 1 APPLICATION TOPICALLY 2 (TWO) TIMES A WEEK. 50 mL 0  . ethynodiol-ethinyl estradiol (ZOVIA 1/35E, 28,) 1-35 MG-MCG tablet 1 tablet Orally Once a day for 28 day(s)    . omeprazole (PRILOSEC) 40 MG capsule TAKE 1 CAPSULE (40 MG TOTAL) BY MOUTH DAILY. 90 capsule 0  . predniSONE (DELTASONE) 10 MG tablet Take 6 tabs on day 1, 5 tabs on day 2, 4 tabs on day 3, 3 tabs on day 4, 2 tabs on day 5, 1 tab on day 6 21 tablet 0  . rizatriptan (MAXALT) 10 MG tablet Take by mouth.    . topiramate (TOPAMAX) 50 MG tablet Take by mouth.    . triamcinolone cream (KENALOG) 0.1 % Apply 1 Application topically 2 (two) times daily. 30 g 2  . venlafaxine (EFFEXOR) 37.5 MG tablet Take 75 mg by mouth daily.     No current facility-administered medications for this visit.    Allergies  Allergen Reactions  . Iodinated Contrast Media Nausea Only  . Multihance [Gadobenate] Nausea And Vomiting     Family History  Problem Relation Age of Onset  . Hypertension Mother   . Healthy Father   . Down syndrome Sister   . Hypertension Maternal Grandmother   . Hypertension Maternal Grandfather   . Hypertension Paternal Grandmother   . Hypertension Paternal Grandfather     Social History   Socioeconomic History  . Marital status: Single    Spouse name: Not on file  . Number of children: Not on file  . Years of education: Not on file  . Highest education level: Not on file  Occupational History  . Not on file  Tobacco Use  . Smoking status: Never  . Smokeless tobacco: Never  Vaping Use  . Vaping status: Never Used  Substance and Sexual Activity  . Alcohol use: Never  . Drug use: Never  . Sexual activity: Yes  Other Topics Concern  . Not on file  Social History Narrative   Mercer is currently in 11th grade at [employer]. She lives at home with mother and sister. She has 3 dogs. She likes to dance.   Social Determinants of Health   Financial Resource Strain: Not on file  Food Insecurity: Not on file  Transportation Needs: Not on file  Physical Activity: Not on file  Stress: Not on file  Social Connections: Not on file  Intimate Partner Violence: Not on file     Constitutional: Patient reports intermittent headaches.  Denies fever, malaise, fatigue, or abrupt weight changes.  HEENT: Denies eye pain, eye redness, ear pain, ringing in the ears, wax buildup, runny nose, nasal congestion, bloody nose, or sore throat. Respiratory: Denies difficulty breathing, shortness of breath, cough or sputum production.   Cardiovascular: Denies chest pain, chest tightness, palpitations or swelling in the hands or feet.  Gastrointestinal: Patient reports constipation.  Denies abdominal pain, bloating, diarrhea or blood in the stool.  GU: Denies urgency, frequency, pain with urination, burning sensation, blood in urine, odor or discharge. Musculoskeletal: Denies decrease in range of  motion, difficulty with gait, muscle pain or joint pain and swelling.  Skin: Denies redness, rashes, lesions or ulcercations.  Neurological: Patient reports inattention, insomnia and neuropathy.  Denies dizziness, difficulty with memory, difficulty with speech or problems with balance and coordination.  Psych: Patient has a history of anxiety and depression.  Denies SI/HI.  No other specific complaints in a complete review of systems (except as listed in HPI above).     Objective:   Physical Exam   BP 136/82 (BP Location: Right Arm, Patient Position: Sitting, Cuff Size: Normal)   Pulse 95   Temp (!) 96.6 F (35.9 C) (Temporal)   Ht 4\' 10"  (1.473 m)   Wt 147 lb (66.7 kg)   SpO2 97%   BMI 30.72 kg/m   Wt Readings from Last 3 Encounters:  01/16/23 145 lb (65.8 kg)  08/18/22 152 lb (68.9 kg)  07/22/22 150 lb (68 kg)    General: Appears her stated age, obese, in NAD. Skin: Warm, dry and intact. HEENT: Head: normal shape and size; Eyes: sclera white, no icterus, conjunctiva pink, PERRLA and EOMs intact;  Neck:  Neck supple, trachea midline. No masses, lumps or thyromegaly present.  Cardiovascular: Normal rate and rhythm. S1,S2 noted.  No murmur, rubs or gallops noted. No JVD or BLE edema.  Pulmonary/Chest: Normal effort and positive vesicular breath sounds. No respiratory distress. No wheezes, rales or ronchi noted.  Abdomen: Soft and nontender. Normal bowel sounds. No distention or masses noted. Liver, spleen and kidneys non palpable. Pelvic: Normal female anatomy. Cervix without mass or lesion. No CMT. Adnexa nonpalpable. Musculoskeletal: Strength 5/5 BUE/BLE. No difficulty with gait.  Neurological: Alert and oriented. Cranial nerves II-XII grossly intact. Coordination normal.  Psychiatric: Mood and affect normal. Behavior is normal. Judgment and thought content normal.    BMET    Component Value Date/Time   NA 137 07/18/2022 1436   K 4.4 07/18/2022 1436   CL 104 07/18/2022  1436   CO2 23 07/18/2022 1436   GLUCOSE 85 07/18/2022 1436   BUN 8 07/18/2022 1436   CREATININE 0.74 07/18/2022 1436   CALCIUM 9.8 07/18/2022 1436   GFRNONAA NOT CALCULATED 02/02/2018 0039   GFRAA NOT CALCULATED 02/02/2018 0039    Lipid Panel     Component Value Date/Time   CHOL 181 06/02/2022 1339   TRIG 124 06/02/2022 1339   HDL 57 06/02/2022 1339   CHOLHDL 3.2 06/02/2022 1339   LDLCALC 101 (H) 06/02/2022 1339    CBC    Component Value Date/Time   WBC 10.6 07/18/2022 1436   RBC 4.51 07/18/2022 1436   HGB 13.3 07/18/2022 1436   HCT 39.0 07/18/2022 1436   PLT 312 07/18/2022 1436   MCV 86.5 07/18/2022 1436   MCH 29.5 07/18/2022 1436   MCHC 34.1 07/18/2022  1436   RDW 12.0 07/18/2022 1436   LYMPHSABS 1.8 02/02/2018 0039   MONOABS 1.7 (H) 02/02/2018 0039   EOSABS 0.1 02/02/2018 0039   BASOSABS 0.1 02/02/2018 0039    Hgb A1C Lab Results  Component Value Date   HGBA1C 4.9 11/25/2021           Assessment & Plan:   Preventative health maintenance:  Flu shot UTD Tetanus today Encouraged her to get her COVID vaccine Pap smear today with STD screen Encouraged her to consume a balanced diet and exercise regimen Advised her to see a dentist annually We will check CBC, c-Met, lipid, A1c, HIV and hep C today  RTC in 6 months, follow-up chronic conditions [name], NP

## 2023-02-03 NOTE — Assessment & Plan Note (Signed)
Encouraged diet and exercise for weight loss ?

## 2023-02-04 ENCOUNTER — Other Ambulatory Visit: Payer: Self-pay | Admitting: Internal Medicine

## 2023-02-04 LAB — HEPATITIS C ANTIBODY: Hepatitis C Ab: NONREACTIVE

## 2023-02-04 LAB — CBC
HCT: 43.7 % (ref 35.0–45.0)
Hemoglobin: 14.4 g/dL (ref 11.7–15.5)
MCH: 28.9 pg (ref 27.0–33.0)
MCHC: 33 g/dL (ref 32.0–36.0)
MCV: 87.8 fL (ref 80.0–100.0)
MPV: 9.8 fL (ref 7.5–12.5)
Platelets: 320 10*3/uL (ref 140–400)
RBC: 4.98 10*6/uL (ref 3.80–5.10)
RDW: 12.7 % (ref 11.0–15.0)
WBC: 10.6 10*3/uL (ref 3.8–10.8)

## 2023-02-04 LAB — COMPLETE METABOLIC PANEL WITH GFR
AG Ratio: 1.6 (calc) (ref 1.0–2.5)
ALT: 17 U/L (ref 6–29)
AST: 14 U/L (ref 10–30)
Albumin: 4.4 g/dL (ref 3.6–5.1)
Alkaline phosphatase (APISO): 91 U/L (ref 31–125)
BUN: 11 mg/dL (ref 7–25)
CO2: 22 mmol/L (ref 20–32)
Calcium: 9.7 mg/dL (ref 8.6–10.2)
Chloride: 106 mmol/L (ref 98–110)
Creat: 0.82 mg/dL (ref 0.50–0.96)
Globulin: 2.8 g/dL (ref 1.9–3.7)
Glucose, Bld: 88 mg/dL (ref 65–99)
Potassium: 3.9 mmol/L (ref 3.5–5.3)
Sodium: 138 mmol/L (ref 135–146)
Total Bilirubin: 1 mg/dL (ref 0.2–1.2)
Total Protein: 7.2 g/dL (ref 6.1–8.1)
eGFR: 104 mL/min/{1.73_m2} (ref >=60)

## 2023-02-04 LAB — LIPID PANEL
Cholesterol: 191 mg/dL (ref <200)
HDL: 51 mg/dL (ref >=50)
LDL Cholesterol (Calc): 110 mg/dL — ABNORMAL HIGH
Non-HDL Cholesterol (Calc): 140 mg/dL — ABNORMAL HIGH (ref <130)
Total CHOL/HDL Ratio: 3.7 (calc) (ref <5.0)
Triglycerides: 183 mg/dL — ABNORMAL HIGH (ref <150)

## 2023-02-04 LAB — HEMOGLOBIN A1C
Hgb A1c MFr Bld: 5.3 %{Hb} (ref <5.7)
Mean Plasma Glucose: 105 mg/dL
eAG (mmol/L): 5.8 mmol/L

## 2023-02-04 LAB — HIV ANTIBODY (ROUTINE TESTING W REFLEX): HIV 1&2 Ab, 4th Generation: NONREACTIVE

## 2023-02-04 LAB — RPR: RPR Ser Ql: NONREACTIVE

## 2023-02-06 NOTE — Telephone Encounter (Signed)
Requested medication (s) are due for refill today: yes  Requested medication (s) are on the active medication list: yes  Last refill:  11/08/22 #90 0 refills  Future visit scheduled: no   Notes to clinic:  last OV 02/03/23 last ordered by DarrenWohl, MD on 11/08/22. Do you want to order/ refill Rx?     Requested Prescriptions  Pending Prescriptions Disp Refills   omeprazole (PRILOSEC) 40 MG capsule [Pharmacy Med Name: OMEPRAZOLE DR 40 MG CAPSULE] 90 capsule 0    Sig: TAKE 1 CAPSULE (40 MG TOTAL) BY MOUTH DAILY.     There is no refill protocol information for this order

## 2023-02-07 LAB — CYTOLOGY - PAP
Adequacy: ABSENT
Chlamydia: NEGATIVE
Comment: NEGATIVE
Comment: NEGATIVE
Comment: NEGATIVE
Comment: NORMAL
Diagnosis: NEGATIVE
High risk HPV: NEGATIVE
Neisseria Gonorrhea: NEGATIVE
Trichomonas: NEGATIVE

## 2023-02-13 ENCOUNTER — Encounter: Payer: Self-pay | Admitting: Internal Medicine

## 2023-04-25 ENCOUNTER — Encounter: Payer: Self-pay | Admitting: Internal Medicine

## 2023-05-04 ENCOUNTER — Encounter: Payer: Self-pay | Admitting: Internal Medicine

## 2023-05-15 ENCOUNTER — Ambulatory Visit: Payer: No Typology Code available for payment source | Admitting: Internal Medicine

## 2023-05-15 ENCOUNTER — Encounter: Payer: Self-pay | Admitting: Internal Medicine

## 2023-05-15 VITALS — BP 128/82 | Ht <= 58 in | Wt 142.0 lb

## 2023-05-15 DIAGNOSIS — I889 Nonspecific lymphadenitis, unspecified: Secondary | ICD-10-CM | POA: Diagnosis not present

## 2023-05-15 NOTE — Patient Instructions (Signed)
 Lymphadenopathy  Lymphadenopathy is when your lymph glands are swollen or larger than normal.  Lymph glands, also called lymph nodes, are clumps of tissue. They filter germs and waste from tissues in your body to your bloodstream. They're part of your body's defense system, or immune system. Lymphadenopathy has different causes, like infection, autoimmune disease, and cancer. Lymphadenopathy can happen wherever you have lymph nodes. The type you have depends on which nodes it's in, such as: Cervical lymphadenopathy. This is in the neck. Mediastinal lymphadenopathy. This is in the chest. Hilar lymphadenopathy. This is in the lungs. Axillary lymphadenopathy. This is in the armpits. Inguinal lymphadenopathy. This is in the groin. Sometimes, fluid and cells that fight infection build up in your lymph nodes. This happens when your immune system reacts to germs or other substances that get into your body. This makes lymph nodes swell and get bigger. Treatment is based on what's thought to be the cause. Sometimes, lymph nodes don't go back to normal size after treatment. If yours don't, your health care provider may order tests to help learn why your glands are still swollen and big. Follow these instructions at home:  Take over-the-counter and prescription medicines only as told by your provider. If you were prescribed antibiotics, do not stop using them, even if you start to feel better. If told, apply heat to swollen lymph nodes as told by your provider. Use the heat source that your provider recommends, such as a moist heat pack or a heating pad. Place a towel between your skin and the heat source. Leave the heat on only for the time told by your provider to avoid injury. If your skin turns bright red, remove the heat right away to prevent burns. The risk of burns is higher if you cannot feel pain, heat, or cold. Check your swollen lymph nodes every day for changes. Check other places where you have  lymph nodes as told. Check for changes such as: More swelling. Sudden growth in size. Redness or pain. Hardness. Contact a health care provider if: You have lymph nodes that: Are still swollen after 2 weeks. Have gotten bigger all of a sudden or the swelling spreads. Are red, painful, or hard. Fluid leaks from the skin near a swollen lymph node. You get a fever, chills, or night sweats. You feel tired. You have a sore throat. Your abdomen hurts. You lose weight without trying. This information is not intended to replace advice given to you by your health care provider. Make sure you discuss any questions you have with your health care provider. Document Revised: 07/06/2022 Document Reviewed: 07/06/2022 Elsevier Patient Education  2024 [employer].

## 2023-05-15 NOTE — Progress Notes (Signed)
Subjective:    Patient ID: Michele Dunlap, female    DOB: 03-Feb-2001, 23 y.o.   MRN: [id number]  HPI  Discussed the use of AI scribe software for clinical note transcription with the patient, who gave verbal consent to proceed.   The patient presents with concerns about multiple swellings located on the back of her right ear and right side of her head, which have been present for several months. She reports that these swellings have increased in size over time, but denies any drainage from the areas. The patient does not specify any associated symptoms such as itching, burning, pain, or tenderness. The swellings are located on the mastoid bone and around the occipital bone. The patient has not sought prior medical attention for these swellings.       Review of Systems  Past Medical History:  Diagnosis Date   Allergy    Complication of anesthesia    Started to wake during EGD (age 30)   Migraine    couple times per week    Current Outpatient Medications  Medication Sig Dispense Refill   amphetamine-dextroamphetamine (ADDERALL XR) 15 MG 24 hr capsule Take by mouth every morning.     clobetasol (TEMOVATE) 0.05 % external solution APPLY 1 APPLICATION TOPICALLY 2 (TWO) TIMES A WEEK. 50 mL 0   ethynodiol-ethinyl estradiol (ZOVIA 1/35E, 28,) 1-35 MG-MCG tablet 1 tablet Orally Once a day for 28 day(s)     omeprazole (PRILOSEC) 40 MG capsule TAKE 1 CAPSULE (40 MG TOTAL) BY MOUTH DAILY. 90 capsule 1   rizatriptan (MAXALT) 10 MG tablet Take by mouth.     topiramate (TOPAMAX) 50 MG tablet Take by mouth.     venlafaxine (EFFEXOR) 37.5 MG tablet Take 75 mg by mouth daily.     No current facility-administered medications for this visit.    Allergies  Allergen Reactions   Iodinated Contrast Media Nausea Only   Multihance [Gadobenate] Nausea And Vomiting    Family History  Problem Relation Age of Onset   Hypertension Mother    Healthy Father    Down syndrome Sister    Hypertension  Maternal Grandmother    Hypertension Maternal Grandfather    Hypertension Paternal Grandmother    Hypertension Paternal Grandfather     Social History   Socioeconomic History   Marital status: Single    Spouse name: Not on file   Number of children: Not on file   Years of education: Not on file   Highest education level: Not on file  Occupational History   Not on file  Tobacco Use   Smoking status: Never   Smokeless tobacco: Never  Vaping Use   Vaping status: Never Used  Substance and Sexual Activity   Alcohol use: Never   Drug use: Never   Sexual activity: Yes  Other Topics Concern   Not on file  Social History Narrative   Terryl is currently in 11th grade at [employer]. She lives at home with mother and sister. She has 3 dogs. She likes to dance.   Social Drivers of Corporate investment banker Strain: Not on file  Food Insecurity: Not on file  Transportation Needs: Not on file  Physical Activity: Not on file  Stress: Not on file  Social Connections: Not on file  Intimate Partner Violence: Not on file     Constitutional: Patient reports intermittent headaches.  Denies fever, malaise, fatigue, or abrupt weight changes.  HEENT: Denies eye pain, eye redness, ear pain,  ringing in the ears, wax buildup, runny nose, nasal congestion, bloody nose, or sore throat. Respiratory: Denies difficulty breathing, shortness of breath, cough or sputum production.   Cardiovascular: Denies chest pain, chest tightness, palpitations or swelling in the hands or feet.  Musculoskeletal: Denies decrease in range of motion, difficulty with gait, muscle pain or joint pain and swelling.  Skin: Patient reports mass behind right ear and right side of head.  Denies redness, rashes, lesions or ulcercations.  Neurological: Patient reports inattention, insomnia and neuropathy.  Denies dizziness, difficulty with memory, difficulty with speech or problems with balance and coordination.  Psych: Patient  has a history of anxiety and depression.  Denies SI/HI.  No other specific complaints in a complete review of systems (except as listed in HPI above).     Objective:   Physical Exam  BP 128/82 (BP Location: Left Arm, Patient Position: Sitting, Cuff Size: Normal)   Ht 4\' 10"  (1.473 m)   Wt 142 lb (64.4 kg)   LMP 05/08/2023 (Approximate)   BMI 29.68 kg/m    Wt Readings from Last 3 Encounters:  02/03/23 147 lb (66.7 kg)  01/16/23 145 lb (65.8 kg)  08/18/22 152 lb (68.9 kg)    General: Appears her stated age, obese, in NAD. Skin: Warm, dry and intact.  1 cm lymph node, soft and mobile noted in the right posterior auricular region.  No occipital node noted in the area of concern on the right side of the head. HEENT: Head: normal shape and size; Eyes: sclera white, no icterus, conjunctiva pink, PERRLA and EOMs intact;  Neck:  Neck supple, trachea midline. No masses, lumps or thyromegaly present.  Cardiovascular: Normal rate and rhythm.  Pulmonary/Chest: Normal effort and positive vesicular breath sounds. No respiratory distress. No wheezes, rales or ronchi noted.  Neurological: Alert and oriented.   BMET    Component Value Date/Time   NA 138 02/03/2023 1111   K 3.9 02/03/2023 1111   CL 106 02/03/2023 1111   CO2 22 02/03/2023 1111   GLUCOSE 88 02/03/2023 1111   BUN 11 02/03/2023 1111   CREATININE 0.82 02/03/2023 1111   CALCIUM 9.7 02/03/2023 1111   GFRNONAA NOT CALCULATED 02/02/2018 0039   GFRAA NOT CALCULATED 02/02/2018 0039    Lipid Panel     Component Value Date/Time   CHOL 191 02/03/2023 1111   TRIG 183 (H) 02/03/2023 1111   HDL 51 02/03/2023 1111   CHOLHDL 3.7 02/03/2023 1111   LDLCALC 110 (H) 02/03/2023 1111    CBC    Component Value Date/Time   WBC 10.6 02/03/2023 1111   RBC 4.98 02/03/2023 1111   HGB 14.4 02/03/2023 1111   HCT 43.7 02/03/2023 1111   PLT 320 02/03/2023 1111   MCV 87.8 02/03/2023 1111   MCH 28.9 02/03/2023 1111   MCHC 33.0 02/03/2023  1111   RDW 12.7 02/03/2023 1111   LYMPHSABS 1.8 02/02/2018 0039   MONOABS 1.7 (H) 02/02/2018 0039   EOSABS 0.1 02/02/2018 0039   BASOSABS 0.1 02/02/2018 0039    Hgb A1C Lab Results  Component Value Date   HGBA1C 5.3 02/03/2023           Assessment & Plan:   Assessment and Plan     Post Auricular Lymphadenopathy   Swollen lymph nodes present for months, increasing in size. No signs of infection or systemic lymphadenopathy. Discussed the benign nature of isolated lymphadenopathy and the importance of lymph nodes in infection control.   -Observe lymph nodes and  report if she increases significantly in size.   -Check lymph nodes at next appointment on August 04, 2023.     RTC in 3 months for follow-up of chronic conditions [name], NP

## 2023-05-25 ENCOUNTER — Telehealth: Payer: No Typology Code available for payment source | Admitting: Nurse Practitioner

## 2023-05-25 DIAGNOSIS — F329 Major depressive disorder, single episode, unspecified: Secondary | ICD-10-CM | POA: Insufficient documentation

## 2023-05-25 DIAGNOSIS — S59901A Unspecified injury of right elbow, initial encounter: Secondary | ICD-10-CM | POA: Diagnosis not present

## 2023-05-25 DIAGNOSIS — R109 Unspecified abdominal pain: Secondary | ICD-10-CM | POA: Insufficient documentation

## 2023-05-25 DIAGNOSIS — N946 Dysmenorrhea, unspecified: Secondary | ICD-10-CM | POA: Insufficient documentation

## 2023-05-25 DIAGNOSIS — N938 Other specified abnormal uterine and vaginal bleeding: Secondary | ICD-10-CM | POA: Insufficient documentation

## 2023-05-25 DIAGNOSIS — R062 Wheezing: Secondary | ICD-10-CM | POA: Insufficient documentation

## 2023-05-25 DIAGNOSIS — R519 Headache, unspecified: Secondary | ICD-10-CM | POA: Insufficient documentation

## 2023-05-25 NOTE — Progress Notes (Signed)
Virtual Visit Consent   Michele Dunlap, you are scheduled for a virtual visit with a [facility] provider today. Just as with appointments in the office, your consent must be obtained to participate. Your consent will be active for this visit and any virtual visit you may have with one of our providers in the next 365 days. If you have a MyChart account, a copy of this consent can be sent to you electronically.  As this is a virtual visit, video technology does not allow for your provider to perform a traditional examination. This may limit your provider's ability to fully assess your condition. If your provider identifies any concerns that need to be evaluated in person or the need to arrange testing (such as labs, EKG, etc.), we will make arrangements to do so. Although advances in technology are sophisticated, we cannot ensure that it will always work on either your end or our end. If the connection with a video visit is poor, the visit may have to be switched to a telephone visit. With either a video or telephone visit, we are not always able to ensure that we have a secure connection.  By engaging in this virtual visit, you consent to the provision of healthcare and authorize for your insurance to be billed (if applicable) for the services provided during this visit. Depending on your insurance coverage, you may receive a charge related to this service.  I need to obtain your verbal consent now. Are you willing to proceed with your visit today? Michele Dunlap has provided verbal consent on 05/25/2023 for a virtual visit (video or telephone). Viviano Simas, FNP  Date: 05/25/2023 7:43 PM  Virtual Visit via Video Note   I, Viviano Simas, connected with  Michele Dunlap  ([id number], Sep 20, 2000) on 05/25/23 at  7:45 PM EST by a video-enabled telemedicine application and verified that I am speaking with the correct person using two identifiers.  Location: Patient: Virtual Visit Location Patient:  Home Provider: Virtual Visit Location Provider: Home Office   I discussed the limitations of evaluation and management by telemedicine and the availability of in person appointments. The patient expressed understanding and agreed to proceed.    History of Present Illness: Michele Dunlap is a 23 y.o. who identifies as a female who was assigned female at birth, and is being seen today for pain in her elbow  When she was walking to class yesterday she tripped and fell onto the brick sidewalk. She fell onto her elbow  The elbow is stiff  She is having a hard time holding objects  Even holding her phone is hurting her arm  She is finding the need to rest her arm on things  Pain is worse with straightening her arm   The injury is to her right elbow   She does have swelling and bruising around her elbow  She is a Consulting civil engineer at [employer]     Problems:  Patient Active Problem List   Diagnosis Date Noted   Abdominal pain 05/25/2023   Dysfunctional uterine bleeding 05/25/2023   Dysmenorrhea 05/25/2023   Headache 05/25/2023   Major depressive disorder, single episode, unspecified 05/25/2023   Wheezing 05/25/2023   ADD (attention deficit disorder) 08/18/2022   Eczema 08/18/2022   Idiopathic neuropathy 06/02/2022   Pure hypercholesterolemia 06/02/2022   Insomnia 01/10/2022   Class 1 obesity due to excess calories with body mass index (BMI) of 30.0 to 30.9 in adult 11/25/2021   MDD (major depressive  disorder), recurrent severe, 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   Anxiety and depression 03/18/2020   Eosinophilic esophagitis 02/12/2018   Migraine 02/27/2012    Allergies:  Allergies  Allergen Reactions   Iodinated Contrast Media Nausea Only   Multihance [Gadobenate] Nausea And Vomiting   Medications:  Current Outpatient Medications:    amphetamine-dextroamphetamine (ADDERALL XR) 15 MG 24 hr capsule, Take by mouth every morning., Disp: , Rfl:    citalopram (CELEXA) 20 MG tablet,  Take 20 mg by mouth daily., Disp: , Rfl:    ethynodiol-ethinyl estradiol (ZOVIA 1/35E, 28,) 1-35 MG-MCG tablet, 1 tablet Orally Once a day for 28 day(s), Disp: , Rfl:    omeprazole (PRILOSEC) 40 MG capsule, TAKE 1 CAPSULE (40 MG TOTAL) BY MOUTH DAILY., Disp: 90 capsule, Rfl: 1   rizatriptan (MAXALT) 10 MG tablet, Take by mouth., Disp: , Rfl:    topiramate (TOPAMAX) 50 MG tablet, Take by mouth., Disp: , Rfl:    venlafaxine (EFFEXOR) 37.5 MG tablet, Take 75 mg by mouth daily., Disp: , Rfl:   Observations/Objective: Patient is well-developed, well-nourished in no acute distress.  Resting comfortably  at home.  Head is normocephalic, atraumatic.  No labored breathing.  Speech is clear and coherent with logical content.  Patient is alert and oriented at baseline.  Right elbow edema, bony landmark is still notable  Abrasion to elbow  Able to straighten arm with pain   Assessment and Plan:  1. Elbow injury, right, initial encounter (Primary)  Advised she is seen in person tomorrow for XRAY  She can ice tonight and use tylenol  Discussed how to make an appointment with the [facility] or walk in hours at [facility]     Patient is agreeable to plan    Follow Up Instructions: I discussed the assessment and treatment plan with the patient. The patient was provided an opportunity to ask questions and all were answered. The patient agreed with the plan and demonstrated an understanding of the instructions.  A copy of instructions were sent to the patient via MyChart unless otherwise noted below.     The patient was advised to call back or seek an in-person evaluation if the symptoms worsen or if the condition fails to improve as anticipated.    Viviano Simas, FNP

## 2023-06-29 ENCOUNTER — Encounter: Payer: Self-pay | Admitting: Internal Medicine

## 2023-07-06 ENCOUNTER — Ambulatory Visit: Admitting: Internal Medicine

## 2023-07-06 ENCOUNTER — Telehealth: Admitting: Family Medicine

## 2023-07-06 ENCOUNTER — Encounter: Payer: Self-pay | Admitting: Internal Medicine

## 2023-07-06 DIAGNOSIS — K529 Noninfective gastroenteritis and colitis, unspecified: Secondary | ICD-10-CM | POA: Diagnosis not present

## 2023-07-06 MED ORDER — ONDANSETRON 4 MG PO TBDP: 4.0000 mg | ORAL_TABLET | Freq: Three times a day (TID) | ORAL | 0 refills | Status: AC | PRN

## 2023-07-06 NOTE — Progress Notes (Signed)
 E-Visit for Nausea and Vomiting   We are sorry that you are not feeling well. Here is how we plan to help!  Based on what you have shared with me it looks like you have a Virus that is irritating your GI tract.  Vomiting is the forceful emptying of a portion of the stomach's content through the mouth.  Although nausea and vomiting can make you feel miserable, it's important to remember that these are not diseases, but rather symptoms of an underlying illness.  When we treat short term symptoms, we always caution that any symptoms that persist should be fully evaluated in a medical office.  I have prescribed a medication that will help alleviate your symptoms and allow you to stay hydrated:  Zofran 4 mg 1 tablet every 8 hours as needed for nausea and vomiting  HOME CARE: Drink clear liquids.  This is very important! Dehydration (the lack of fluid) can lead to a serious complication.  Start off with 1 tablespoon every 5 minutes for 8 hours. You may begin eating bland foods after 8 hours without vomiting.  Start with saltine crackers, white bread, rice, mashed potatoes, applesauce. After 48 hours on a bland diet, you may resume a normal diet. Try to go to sleep.  Sleep often empties the stomach and relieves the need to vomit.  GET HELP RIGHT AWAY IF:  Your symptoms do not improve or worsen within 2 days after treatment. You have a fever for over 3 days. You cannot keep down fluids after trying the medication.  MAKE SURE YOU:  Understand these instructions. Will watch your condition. Will get help right away if you are not doing well or get worse.    Thank you for choosing an e-visit.  Your e-visit answers were reviewed by a board certified advanced clinical practitioner to complete your personal care plan. Depending upon the condition, your plan could have included both over the counter or prescription medications.  Please review your pharmacy choice. Make sure the pharmacy is open so  you can pick up prescription now. If there is a problem, you may contact your provider through [employer] and have the prescription routed to another pharmacy.  Your safety is important to [place]. If you have drug allergies check your prescription carefully.   For the next 24 hours you can use MyChart to ask questions about today's visit, request a non-urgent call back, or ask for a work or school excuse. You will get an email in the next two days asking about your experience. I hope that your e-visit has been valuable and will speed your recovery.  I provided 5 minutes of non face-to-face time during this encounter for chart review, medication and order placement, as well as and documentation.

## 2023-07-06 NOTE — Progress Notes (Deleted)
 Virtual Visit via Video Note  I connected with Michele Dunlap on 07/06/23 at  2:40 PM EDT by a video enabled telemedicine application and verified that I am speaking with the correct person using two identifiers.  Location: Patient: *** Provider: Office  Person's participating in this video call: [name], NP-C and [name]   I discussed the limitations of evaluation and management by telemedicine and the availability of in person appointments. The patient expressed understanding and agreed to proceed.  History of Present Illness:    Past Medical History:  Diagnosis Date   Allergy    Complication of anesthesia    Started to wake during EGD (age 31)   Migraine    couple times per week    Current Outpatient Medications  Medication Sig Dispense Refill   amphetamine-dextroamphetamine (ADDERALL XR) 15 MG 24 hr capsule Take by mouth every morning.     citalopram (CELEXA) 20 MG tablet Take 20 mg by mouth daily.     ethynodiol-ethinyl estradiol (ZOVIA 1/35E, 28,) 1-35 MG-MCG tablet 1 tablet Orally Once a day for 28 day(s)     omeprazole (PRILOSEC) 40 MG capsule TAKE 1 CAPSULE (40 MG TOTAL) BY MOUTH DAILY. 90 capsule 1   ondansetron (ZOFRAN-ODT) 4 MG disintegrating tablet Take 1 tablet (4 mg total) by mouth every 8 (eight) hours as needed for nausea or vomiting. 20 tablet 0   rizatriptan (MAXALT) 10 MG tablet Take by mouth.     topiramate (TOPAMAX) 50 MG tablet Take by mouth.     venlafaxine (EFFEXOR) 37.5 MG tablet Take 75 mg by mouth daily.     No current facility-administered medications for this visit.    Allergies  Allergen Reactions   Iodinated Contrast Media Nausea Only   Multihance [Gadobenate] Nausea And Vomiting    Family History  Problem Relation Age of Onset   Hypertension Mother    Healthy Father    Down syndrome Sister    Hypertension Maternal Grandmother    Hypertension Maternal Grandfather    Hypertension Paternal Grandmother    Hypertension  Paternal Grandfather     Social History   Socioeconomic History   Marital status: Single    Spouse name: Not on file   Number of children: Not on file   Years of education: Not on file   Highest education level: Not on file  Occupational History   Not on file  Tobacco Use   Smoking status: Never   Smokeless tobacco: Never  Vaping Use   Vaping status: Never Used  Substance and Sexual Activity   Alcohol use: Never   Drug use: Never   Sexual activity: Yes  Other Topics Concern   Not on file  Social History Narrative   Michele Dunlap is currently in 11th grade at [employer]. She lives at home with mother and sister. She has 3 dogs. She likes to dance.   Social Drivers of Corporate investment banker Strain: Not on file  Food Insecurity: Not on file  Transportation Needs: Not on file  Physical Activity: Not on file  Stress: Not on file  Social Connections: Not on file  Intimate Partner Violence: Not on file     Constitutional: Denies fever, malaise, fatigue, headache or abrupt weight changes.  HEENT: Denies eye pain, eye redness, ear pain, ringing in the ears, wax buildup, runny nose, nasal congestion, bloody nose, or sore throat. Respiratory: Denies difficulty breathing, shortness of breath, cough or sputum production.   Cardiovascular: Denies chest  pain, chest tightness, palpitations or swelling in the hands or feet.  Gastrointestinal: Denies abdominal pain, bloating, constipation, diarrhea or blood in the stool.  GU: Denies urgency, frequency, pain with urination, burning sensation, blood in urine, odor or discharge. Musculoskeletal: Denies decrease in range of motion, difficulty with gait, muscle pain or joint pain and swelling.  Skin: Denies redness, rashes, lesions or ulcercations.  Neurological: Denies dizziness, difficulty with memory, difficulty with speech or problems with balance and coordination.  Psych: Denies anxiety, depression, SI/HI.  No other specific  complaints in a complete review of systems (except as listed in HPI above).  Observations/Objective:  There were no vitals taken for this visit. Wt Readings from Last 3 Encounters:  05/15/23 142 lb (64.4 kg)  02/03/23 147 lb (66.7 kg)  01/16/23 145 lb (65.8 kg)    General: Appears their stated age, well developed, well nourished in NAD. Skin: Warm, dry and intact. No rashes, lesions or ulcerations noted. HEENT: Head: normal shape and size; Eyes: sclera white, no icterus, conjunctiva pink, PERRLA and EOMs intact; Ears: Tm's gray and intact, normal light reflex; Nose: mucosa pink and moist, septum midline; Throat/Mouth: Teeth present, mucosa pink and moist, no exudate, lesions or ulcerations noted.  Neck:  Neck supple, trachea midline. No masses, lumps or thyromegaly present.  Cardiovascular: Normal rate and rhythm. S1,S2 noted.  No murmur, rubs or gallops noted. No JVD or BLE edema. No carotid bruits noted. Pulmonary/Chest: Normal effort and positive vesicular breath sounds. No respiratory distress. No wheezes, rales or ronchi noted.  Abdomen: Soft and nontender. Normal bowel sounds. No distention or masses noted. Liver, spleen and kidneys non palpable. Musculoskeletal: Normal range of motion. No signs of joint swelling. No difficulty with gait.  Neurological: Alert and oriented. Cranial nerves II-XII grossly intact. Coordination normal.  Psychiatric: Mood and affect normal. Behavior is normal. Judgment and thought content normal.     BMET    Component Value Date/Time   NA 138 02/03/2023 1111   K 3.9 02/03/2023 1111   CL 106 02/03/2023 1111   CO2 22 02/03/2023 1111   GLUCOSE 88 02/03/2023 1111   BUN 11 02/03/2023 1111   CREATININE 0.82 02/03/2023 1111   CALCIUM 9.7 02/03/2023 1111   GFRNONAA NOT CALCULATED 02/02/2018 0039   GFRAA NOT CALCULATED 02/02/2018 0039    Lipid Panel     Component Value Date/Time   CHOL 191 02/03/2023 1111   TRIG 183 (H) 02/03/2023 1111   HDL 51  02/03/2023 1111   CHOLHDL 3.7 02/03/2023 1111   LDLCALC 110 (H) 02/03/2023 1111    CBC    Component Value Date/Time   WBC 10.6 02/03/2023 1111   RBC 4.98 02/03/2023 1111   HGB 14.4 02/03/2023 1111   HCT 43.7 02/03/2023 1111   PLT 320 02/03/2023 1111   MCV 87.8 02/03/2023 1111   MCH 28.9 02/03/2023 1111   MCHC 33.0 02/03/2023 1111   RDW 12.7 02/03/2023 1111   LYMPHSABS 1.8 02/02/2018 0039   MONOABS 1.7 (H) 02/02/2018 0039   EOSABS 0.1 02/02/2018 0039   BASOSABS 0.1 02/02/2018 0039    Hgb A1C Lab Results  Component Value Date   HGBA1C 5.3 02/03/2023       Assessment and Plan:  RTC in 1 month for follow-up of chronic conditions  Follow Up Instructions:    I discussed the assessment and treatment plan with the patient. The patient was provided an opportunity to ask questions and all were answered. The patient agreed with the  plan and demonstrated an understanding of the instructions.   The patient was advised to call back or seek an in-person evaluation if the symptoms worsen or if the condition fails to improve as anticipated.   [name], NP

## 2023-07-21 ENCOUNTER — Telehealth: Admitting: Nurse Practitioner

## 2023-07-21 DIAGNOSIS — R6889 Other general symptoms and signs: Secondary | ICD-10-CM

## 2023-07-21 NOTE — Progress Notes (Signed)
   Thank you for the details you included in the comment boxes. Those details are very helpful in determining the best course of treatment for you and help [place] to provide the best care.Because of your respiratory symptoms , we recommend that you schedule a Virtual Urgent Care video visit or be seen in  person at an Urgent Care Facility in order for the provider to better assess what is going on.  The provider will be able to give you a more accurate diagnosis and treatment plan if we can more freely discuss your symptoms and with the addition of a virtual examination.   If you change your visit to a video visit, we will bill your insurance (similar to an office visit) and you will not be charged for this e-Visit. You will be able to stay at home and speak with the first available [facility] advanced practice provider. The link to do a video visit is in the drop down Menu tab of your Welcome screen in MyChart.

## 2023-07-31 ENCOUNTER — Telehealth: Payer: Self-pay

## 2023-07-31 MED ORDER — CLOBETASOL PROPIONATE 0.05 % EX SOLN: 1.0000 | CUTANEOUS | 0 refills | Status: DC

## 2023-07-31 NOTE — Addendum Note (Signed)
 Addended by: Lorre Munroe on: 07/31/2023 09:42 AM   Modules accepted: Orders

## 2023-07-31 NOTE — Telephone Encounter (Signed)
 refilled

## 2023-07-31 NOTE — Telephone Encounter (Signed)
 Requesting refills on clobestal 0.05% solution apply 1 application topically 2 times per week. [facility] [address] [place]. Okay for refill? Did not see on current list. Thanks!

## 2023-08-01 ENCOUNTER — Other Ambulatory Visit: Payer: Self-pay | Admitting: Internal Medicine

## 2023-08-01 ENCOUNTER — Telehealth: Payer: Self-pay

## 2023-08-01 NOTE — Telephone Encounter (Signed)
 [facility] from [facility] in [place].

## 2023-08-02 ENCOUNTER — Encounter: Payer: Self-pay | Admitting: Internal Medicine

## 2023-08-02 ENCOUNTER — Other Ambulatory Visit: Payer: Self-pay | Admitting: Internal Medicine

## 2023-08-02 NOTE — Telephone Encounter (Signed)
 Requested Prescriptions  Pending Prescriptions Disp Refills   omeprazole (PRILOSEC) 40 MG capsule [Pharmacy Med Name: OMEPRAZOLE DR 40 MG CAPSULE] 90 capsule 0    Sig: TAKE 1 CAPSULE (40 MG TOTAL) BY MOUTH DAILY.     Gastroenterology: Proton Pump Inhibitors Failed - 08/02/2023  8:14 AM      Failed - Valid encounter within last 12 months    Recent Outpatient Visits           11 years ago Pain of sternum   [facility] at [facility], Gwenlyn Found, MD   11 years ago Impetigo   [facility], Leotis Shames, MD       Future Appointments             In 2 days Baity, Salvadore Oxford, NP [facility], [facility]

## 2023-08-04 ENCOUNTER — Ambulatory Visit: Admitting: Internal Medicine

## 2023-08-04 ENCOUNTER — Encounter: Payer: Self-pay | Admitting: Internal Medicine

## 2023-08-04 ENCOUNTER — Ambulatory Visit: Payer: Self-pay | Admitting: Internal Medicine

## 2023-08-04 VITALS — BP 122/84 | Ht <= 58 in | Wt 146.2 lb

## 2023-08-04 DIAGNOSIS — K200 Eosinophilic esophagitis: Secondary | ICD-10-CM

## 2023-08-04 DIAGNOSIS — Z6830 Body mass index (BMI) 30.0-30.9, adult: Secondary | ICD-10-CM

## 2023-08-04 DIAGNOSIS — F419 Anxiety disorder, unspecified: Secondary | ICD-10-CM

## 2023-08-04 DIAGNOSIS — G43C1 Periodic headache syndromes in child or adult, intractable: Secondary | ICD-10-CM | POA: Diagnosis not present

## 2023-08-04 DIAGNOSIS — G609 Hereditary and idiopathic neuropathy, unspecified: Secondary | ICD-10-CM | POA: Diagnosis not present

## 2023-08-04 DIAGNOSIS — E6609 Other obesity due to excess calories: Secondary | ICD-10-CM

## 2023-08-04 DIAGNOSIS — Z8269 Family history of other diseases of the musculoskeletal system and connective tissue: Secondary | ICD-10-CM | POA: Diagnosis not present

## 2023-08-04 DIAGNOSIS — F32A Depression, unspecified: Secondary | ICD-10-CM

## 2023-08-04 DIAGNOSIS — Z832 Family history of diseases of the blood and blood-forming organs and certain disorders involving the immune mechanism: Secondary | ICD-10-CM

## 2023-08-04 DIAGNOSIS — R41840 Attention and concentration deficit: Secondary | ICD-10-CM

## 2023-08-04 DIAGNOSIS — E7800 Pure hypercholesterolemia, unspecified: Secondary | ICD-10-CM

## 2023-08-04 DIAGNOSIS — F5104 Psychophysiologic insomnia: Secondary | ICD-10-CM

## 2023-08-04 DIAGNOSIS — E66811 Obesity, class 1: Secondary | ICD-10-CM

## 2023-08-04 NOTE — Assessment & Plan Note (Signed)
 Not medicated Will monitor

## 2023-08-04 NOTE — Assessment & Plan Note (Signed)
Continue Adderall per psychiatry

## 2023-08-04 NOTE — Assessment & Plan Note (Signed)
 Continue omeprazole She will continue to follow with GI

## 2023-08-04 NOTE — Assessment & Plan Note (Signed)
Will check lipid profile at annual exam Encouraged her to consume low-fat diet

## 2023-08-04 NOTE — Patient Instructions (Signed)
Anti-DNA Antibody Test Why am I having this test? The anti-DNA antibody test helps with the diagnosis and follow-up of systemic lupus erythematosus (SLE). It is also used to monitor treatment of this condition as the antibody decreases with successful therapy. What is being tested? This test measures the amount of anti-DNA antibody in the blood. This antibody is found in 65-80% of people with active SLE. The antibody may also be found in people who have certain other diseases, but this is less common. What kind of sample is taken?  A blood sample is required for this test. It is usually collected by inserting a needle into a blood vessel. How are the results reported? Your test results will be reported as a value. Your test results may also be reported as positive, intermediate, or negative. Your health care provider will compare your results to normal ranges that were established after testing a large group of people (reference values). Reference values may vary among labs and hospitals. For this test, common reference values are: Positive: 10 or more international units/mL. Intermediate: 5-9 international units/mL. Negative: Less than 5 international units/mL. What do the results mean? Positive results, which are associated with results that are higher than the reference values, may indicate: Autoimmune disorders such as SLE. Infectious mononucleosis. Chronic liver conditions. Intermediate results mean that the anti-DNA antibody levels are higher than normal, but not high enough to be considered positive. Negative results mean that you do not have the anti-DNA antibody that is associated with these conditions. Talk with your health care provider about what your results mean. Questions to ask your health care provider Ask your health care provider, or the department that is doing the test: When will my results be ready? How will I get my results? What are my treatment options? What other  tests do I need? What are my next steps? Summary The anti-DNA antibody test helps with the diagnosis and follow-up of systemic lupus erythematosus (SLE). It is also used to monitor treatment of this condition as the antibody decreases with successful therapy. This test measures the amount of anti-DNA antibody in the blood. Elevated levels of anti-DNA antibody can be seen in people with SLE and certain other conditions. This information is not intended to replace advice given to you by your health care provider. Make sure you discuss any questions you have with your health care provider. Document Revised: 12/13/2020 Document Reviewed: 12/13/2020 Elsevier Patient Education  2024 [employer].

## 2023-08-04 NOTE — Assessment & Plan Note (Signed)
 Continue citalopram, lurasidone and carbamazepine per psychiatry Support offered

## 2023-08-04 NOTE — Assessment & Plan Note (Signed)
 Encouraged diet and exercise for weight loss ?

## 2023-08-04 NOTE — Progress Notes (Signed)
 Subjective:    Patient ID: Michele Dunlap, female    DOB: 2000-12-20, 23 y.o.   MRN: [id number]  HPI  Patient presents to clinic today for follow-up of chronic conditions.  Migraines: Triggered by hormones.  She is taking maxalt as needed.  She is no longer taking topomax because she felt like it was interfering with mood. She follows with neurology.  Eosinophilic esophagitis: Managed with omeprazole.  Upper GI from 05/2022 reviewed.  She follows with GI.  Anxiety and depression: Chronic, managed on citalopram, lurasidone and carbamazepine.  She is seeing a therapist and psychriatist.  She denies SI/HI.  ADHD: She reports trouble concentrating. She is taking adderall as prescribed. She follows with psychiatry.   Insomnia: She has difficulty falling asleep.  She is not taking medications for this but has been prescribed trazodone in the past.  There is no sleep study on file.  HLD: Her last LDL was 110, triglycerides 183, 01/2023.  She is not taking any cholesterol-lowering medication at this time.  She does not consume low-fat diet.  Idiopathic neuropathy: She is no longer taking gabapentin. She follows with neurology.  Review of Systems     Past Medical History:  Diagnosis Date   Allergy    Complication of anesthesia    Started to wake during EGD (age 35)   Migraine    couple times per week    Current Outpatient Medications  Medication Sig Dispense Refill   amphetamine-dextroamphetamine (ADDERALL XR) 15 MG 24 hr capsule Take by mouth every morning.     citalopram (CELEXA) 20 MG tablet Take 20 mg by mouth daily.     clobetasol (TEMOVATE) 0.05 % external solution Apply 1 Application topically 2 (two) times a week. 240 mL 0   ethynodiol-ethinyl estradiol (ZOVIA 1/35E, 28,) 1-35 MG-MCG tablet 1 tablet Orally Once a day for 28 day(s)     omeprazole (PRILOSEC) 40 MG capsule TAKE 1 CAPSULE (40 MG TOTAL) BY MOUTH DAILY. 90 capsule 0   ondansetron (ZOFRAN-ODT) 4 MG disintegrating  tablet Take 1 tablet (4 mg total) by mouth every 8 (eight) hours as needed for nausea or vomiting. 20 tablet 0   rizatriptan (MAXALT) 10 MG tablet Take by mouth.     topiramate (TOPAMAX) 50 MG tablet Take by mouth.     venlafaxine (EFFEXOR) 37.5 MG tablet Take 75 mg by mouth daily.     No current facility-administered medications for this visit.    Allergies  Allergen Reactions   Iodinated Contrast Media Nausea Only   Multihance [Gadobenate] Nausea And Vomiting    Family History  Problem Relation Age of Onset   Hypertension Mother    Healthy Father    Down syndrome Sister    Hypertension Maternal Grandmother    Hypertension Maternal Grandfather    Hypertension Paternal Grandmother    Hypertension Paternal Grandfather     Social History   Socioeconomic History   Marital status: Single    Spouse name: Not on file   Number of children: Not on file   Years of education: Not on file   Highest education level: Not on file  Occupational History   Not on file  Tobacco Use   Smoking status: Never   Smokeless tobacco: Never  Vaping Use   Vaping status: Never Used  Substance and Sexual Activity   Alcohol use: Never   Drug use: Never   Sexual activity: Yes  Other Topics Concern   Not on file  Social History  Narrative   Davi is currently in 11th grade at [employer]. She lives at home with mother and sister. She has 3 dogs. She likes to dance.   Social Drivers of Corporate investment banker Strain: Not on file  Food Insecurity: Not on file  Transportation Needs: Not on file  Physical Activity: Not on file  Stress: Not on file  Social Connections: Not on file  Intimate Partner Violence: Not on file     Constitutional: Patient reports intermittent headaches.  Denies fever, malaise, fatigue, or abrupt weight changes.  HEENT: Denies eye pain, eye redness, ear pain, ringing in the ears, wax buildup, runny nose, nasal congestion, bloody nose, or sore  throat. Respiratory: Denies difficulty breathing, shortness of breath, cough or sputum production.   Cardiovascular: Denies chest pain, chest tightness, palpitations or swelling in the hands or feet.  Gastrointestinal: Denies abdominal pain, bloating, constipation, diarrhea or blood in the stool.  GU: Denies urgency, frequency, pain with urination, burning sensation, blood in urine, odor or discharge. Musculoskeletal: Pt reports joint pain in hips and knees. Denies decrease in range of motion, difficulty with gait, muscle pain or joint pain and swelling.  Skin: Denies redness, rashes, lesions or ulcercations.  Neurological: Patient reports inattention, insomnia, neuropathic pain.  Denies dizziness, difficulty with memory, difficulty with speech or problems with balance and coordination.  Psych: Patient has a history of anxiety and depression.  Denies SI/HI.  No other specific complaints in a complete review of systems (except as listed in HPI above).  Objective:   Physical Exam  BP 122/84 (BP Location: Left Arm, Patient Position: Sitting, Cuff Size: Normal)   Ht 4\' 10"  (1.473 m)   Wt 146 lb 3.2 oz (66.3 kg)   LMP 07/28/2023 (Approximate)   BMI 30.56 kg/m    Wt Readings from Last 3 Encounters:  05/15/23 142 lb (64.4 kg)  02/03/23 147 lb (66.7 kg)  01/16/23 145 lb (65.8 kg)    General: Appears her stated age, obese, in NAD. Skin: Warm, dry and intact.  Cardiovascular: Normal rate and rhythm. S1,S2 noted.  No murmur, rubs or gallops noted.  Pulmonary/Chest: Normal effort and positive vesicular breath sounds. No respiratory distress. No wheezes, rales or ronchi noted.  Musculoskeletal: No difficulty with gait.  Neurological: Alert and oriented. Coordination normal.  Psychiatric: Mood and affect normal. Behavior is normal. Judgment and thought content normal.    BMET    Component Value Date/Time   NA 138 02/03/2023 1111   K 3.9 02/03/2023 1111   CL 106 02/03/2023 1111   CO2 22  02/03/2023 1111   GLUCOSE 88 02/03/2023 1111   BUN 11 02/03/2023 1111   CREATININE 0.82 02/03/2023 1111   CALCIUM 9.7 02/03/2023 1111   GFRNONAA NOT CALCULATED 02/02/2018 0039   GFRAA NOT CALCULATED 02/02/2018 0039    Lipid Panel     Component Value Date/Time   CHOL 191 02/03/2023 1111   TRIG 183 (H) 02/03/2023 1111   HDL 51 02/03/2023 1111   CHOLHDL 3.7 02/03/2023 1111   LDLCALC 110 (H) 02/03/2023 1111    CBC    Component Value Date/Time   WBC 10.6 02/03/2023 1111   RBC 4.98 02/03/2023 1111   HGB 14.4 02/03/2023 1111   HCT 43.7 02/03/2023 1111   PLT 320 02/03/2023 1111   MCV 87.8 02/03/2023 1111   MCH 28.9 02/03/2023 1111   MCHC 33.0 02/03/2023 1111   RDW 12.7 02/03/2023 1111   LYMPHSABS 1.8 02/02/2018 0039  MONOABS 1.7 (H) 02/02/2018 0039   EOSABS 0.1 02/02/2018 0039   BASOSABS 0.1 02/02/2018 0039    Hgb A1C Lab Results  Component Value Date   HGBA1C 5.3 02/03/2023           Assessment & Plan:    History of autoimmune disease:  Will check ANA, ESR and CRP Referral to rheumatology per her request for further evaluation  RTC in 6 months for your annual exam [name], NP

## 2023-08-04 NOTE — Assessment & Plan Note (Signed)
 Not medicated We will monitor

## 2023-08-04 NOTE — Assessment & Plan Note (Signed)
 Continue maxalt as needed

## 2023-08-05 ENCOUNTER — Encounter: Payer: Self-pay | Admitting: Internal Medicine

## 2023-08-05 LAB — SEDIMENTATION RATE: Sed Rate: 2 mm/h (ref 0–20)

## 2023-08-05 LAB — ANA: Anti Nuclear Antibody (ANA): NEGATIVE

## 2023-08-05 LAB — C-REACTIVE PROTEIN: CRP: 5.6 mg/L (ref <8.0)

## 2023-08-12 ENCOUNTER — Other Ambulatory Visit: Payer: Self-pay | Admitting: Internal Medicine

## 2023-08-14 NOTE — Telephone Encounter (Signed)
 Requested medication (s) are due for refill today: routing for approval  Requested medication (s) are on the active medication list: yes  Last refill:  unknown  Future visit scheduled: no  Notes to clinic:  Unable to refill per protocol, Rx expired. Not on current med list, possible new Rx needed.      Requested Prescriptions  Pending Prescriptions Disp Refills   TRI-ESTARYLLA 0.18/0.215/0.25 MG-35 MCG tablet [Pharmacy Med Name: TRI-ESTARYLLA TABLET] 84 tablet 1    Sig: TAKE 1 TABLET BY MOUTH EVERY DAY     OB/GYN:  Contraceptives Passed - 08/14/2023  1:00 PM      Passed - Last BP in normal range    BP Readings from Last 1 Encounters:  08/04/23 122/84         Passed - Valid encounter within last 12 months    Recent Outpatient Visits           1 week ago Family history of systemic lupus erythematosus   [place] [facility] [place], Rankin Buzzard, NP   11 years ago Pain of sternum   Primary Care at [facility], Skipper Dumas, MD   11 years ago Impetigo   [facility] at [facility], Sulema Endo, MD              Passed - Patient is not a smoker

## 2023-09-15 ENCOUNTER — Encounter: Payer: Self-pay | Admitting: Internal Medicine

## 2023-10-17 ENCOUNTER — Ambulatory Visit (INDEPENDENT_AMBULATORY_CARE_PROVIDER_SITE_OTHER): Admitting: Internal Medicine

## 2023-10-17 ENCOUNTER — Encounter: Payer: Self-pay | Admitting: Internal Medicine

## 2023-10-17 VITALS — BP 120/78 | Ht <= 58 in | Wt 154.0 lb

## 2023-10-17 DIAGNOSIS — F419 Anxiety disorder, unspecified: Secondary | ICD-10-CM | POA: Diagnosis not present

## 2023-10-17 DIAGNOSIS — E7800 Pure hypercholesterolemia, unspecified: Secondary | ICD-10-CM

## 2023-10-17 DIAGNOSIS — Z5181 Encounter for therapeutic drug level monitoring: Secondary | ICD-10-CM

## 2023-10-17 DIAGNOSIS — E66811 Other obesity due to excess calories: Secondary | ICD-10-CM | POA: Insufficient documentation

## 2023-10-17 DIAGNOSIS — F32A Depression, unspecified: Secondary | ICD-10-CM

## 2023-10-17 DIAGNOSIS — Z6832 Body mass index (BMI) 32.0-32.9, adult: Secondary | ICD-10-CM

## 2023-10-17 DIAGNOSIS — E6609 Other obesity due to excess calories: Secondary | ICD-10-CM

## 2023-10-17 MED ORDER — SEMAGLUTIDE-WEIGHT MANAGEMENT 0.25 MG/0.5ML ~~LOC~~ SOAJ: 0.2500 mg | SUBCUTANEOUS | 0 refills | Status: DC

## 2023-10-17 NOTE — Patient Instructions (Signed)
 Nonsurgical Weight Loss Treatment Options Nonsurgical weight loss has many different components. You will learn how to work with a specialized care team to incorporate diet, exercise, and lifestyle changes into your life. To view the content, go to this web address: [email or web address]  This video will expire on: 04/05/2025. If you need access to this video following this date, please reach out to the healthcare provider who assigned it to you. This information is not intended to replace advice given to you by your health care provider. Make sure you discuss any questions you have with your health care provider. Elsevier Patient Education  2024 [employer].

## 2023-10-17 NOTE — Progress Notes (Signed)
 Subjective:    Patient ID: Michele Dunlap, female    DOB: 2000-12-12, 23 y.o.   MRN: [id number]  HPI  Discussed the use of AI scribe software for clinical note transcription with the patient, who gave verbal consent to proceed.  Michele Dunlap is a 23 year old female who presents for lithium level monitoring and weight management consultation.  She is currently taking lithium 300 mg twice a day for anxiety and depression, as she was feeling depressed despite being on multiple medications. She is also taking citalopram 20 mg daily. Tegretol (carbamazepine) is listed in her medication list but is not being used.  She is seeking weight management options and has inquired about weight loss medications. She has high cholesterol and a BMI of 32.19. She has been going to the gym weekly, although not regularly, and has improved her diet by reducing carbohydrate intake. Despite these efforts, she continues to gain weight, which she attributes to her psychiatric medications.   Review of Systems     Past Medical History:  Diagnosis Date   Allergy    Complication of anesthesia    Started to wake during EGD (age 60)   Migraine    couple times per week    Current Outpatient Medications  Medication Sig Dispense Refill   amphetamine-dextroamphetamine (ADDERALL XR) 15 MG 24 hr capsule Take by mouth every morning.     carbamazepine (TEGRETOL) 200 MG tablet Take 200 mg by mouth.     citalopram (CELEXA) 20 MG tablet Take 20 mg by mouth daily.     clobetasol  (TEMOVATE ) 0.05 % external solution Apply 1 Application topically 2 (two) times a week. 240 mL 0   ethynodiol-ethinyl estradiol (ZOVIA 1/35E, 28,) 1-35 MG-MCG tablet 1 tablet Orally Once a day for 28 day(s)     Lurasidone HCl 60 MG TABS 60 mg.     omeprazole  (PRILOSEC) 40 MG capsule TAKE 1 CAPSULE (40 MG TOTAL) BY MOUTH DAILY. 90 capsule 0   ondansetron  (ZOFRAN -ODT) 4 MG disintegrating tablet Take 1 tablet (4 mg total) by mouth every 8 (eight)  hours as needed for nausea or vomiting. 20 tablet 0   rizatriptan (MAXALT) 10 MG tablet Take by mouth.     No current facility-administered medications for this visit.    Allergies  Allergen Reactions   Iodinated Contrast Media Nausea Only   Multihance  [Gadobenate] Nausea And Vomiting    Family History  Problem Relation Age of Onset   Hypertension Mother    Healthy Father    Down syndrome Sister    Hypertension Maternal Grandmother    Hypertension Maternal Grandfather    Hypertension Paternal Grandmother    Hypertension Paternal Grandfather     Social History   Socioeconomic History   Marital status: Single    Spouse name: Not on file   Number of children: Not on file   Years of education: Not on file   Highest education level: Not on file  Occupational History   Not on file  Tobacco Use   Smoking status: Never   Smokeless tobacco: Never  Vaping Use   Vaping status: Never Used  Substance and Sexual Activity   Alcohol use: Never   Drug use: Never   Sexual activity: Yes  Other Topics Concern   Not on file  Social History Narrative   Dearra is currently in 11th grade at [employer]. She lives at home with mother and sister. She has 3 dogs. She likes to dance.  Social Drivers of Corporate investment banker Strain: Not on file  Food Insecurity: Not on file  Transportation Needs: Not on file  Physical Activity: Not on file  Stress: Not on file  Social Connections: Not on file  Intimate Partner Violence: Not on file     Constitutional: Patient reports intermittent headaches, abnormal weight gain.  Denies fever, malaise, fatigue.  HEENT: Denies eye pain, eye redness, ear pain, ringing in the ears, wax buildup, runny nose, nasal congestion, bloody nose, or sore throat. Respiratory: Denies difficulty breathing, shortness of breath, cough or sputum production.   Cardiovascular: Denies chest pain, chest tightness, palpitations or swelling in the hands or feet.   Gastrointestinal: Denies abdominal pain, bloating, constipation, diarrhea or blood in the stool.  GU: Denies urgency, frequency, pain with urination, burning sensation, blood in urine, odor or discharge. Musculoskeletal: Denies decrease in range of motion, difficulty with gait, muscle pain or joint pain and swelling.  Skin: Denies redness, rashes, lesions or ulcercations.  Neurological: Patient reports inattention, insomnia.  Denies dizziness, difficulty with memory, difficulty with speech or problems with balance and coordination.  Psych: Patient has a history of anxiety and depression.  Denies SI/HI.  No other specific complaints in a complete review of systems (except as listed in HPI above).  Objective:   Physical Exam  BP 120/78 (BP Location: Left Arm, Patient Position: Sitting, Cuff Size: Normal)   Ht 4' 10 (1.473 m)   Wt 154 lb (69.9 kg)   LMP 09/18/2023 (Approximate)   BMI 32.19 kg/m    Wt Readings from Last 3 Encounters:  08/04/23 146 lb 3.2 oz (66.3 kg)  05/15/23 142 lb (64.4 kg)  02/03/23 147 lb (66.7 kg)    General: Appears her stated age, obese, in NAD. Skin: Warm, dry and intact.  Cardiovascular: Normal rate.  Pulmonary/Chest: Normal effort. Musculoskeletal: No difficulty with gait.  Neurological: Alert and oriented. Coordination normal.  Psychiatric: Mood and affect flat. Behavior is normal. Judgment and thought content normal.    BMET    Component Value Date/Time   NA 138 02/03/2023 1111   K 3.9 02/03/2023 1111   CL 106 02/03/2023 1111   CO2 22 02/03/2023 1111   GLUCOSE 88 02/03/2023 1111   BUN 11 02/03/2023 1111   CREATININE 0.82 02/03/2023 1111   CALCIUM 9.7 02/03/2023 1111   GFRNONAA NOT CALCULATED 02/02/2018 0039   GFRAA NOT CALCULATED 02/02/2018 0039    Lipid Panel     Component Value Date/Time   CHOL 191 02/03/2023 1111   TRIG 183 (H) 02/03/2023 1111   HDL 51 02/03/2023 1111   CHOLHDL 3.7 02/03/2023 1111   LDLCALC 110 (H) 02/03/2023  1111    CBC    Component Value Date/Time   WBC 10.6 02/03/2023 1111   RBC 4.98 02/03/2023 1111   HGB 14.4 02/03/2023 1111   HCT 43.7 02/03/2023 1111   PLT 320 02/03/2023 1111   MCV 87.8 02/03/2023 1111   MCH 28.9 02/03/2023 1111   MCHC 33.0 02/03/2023 1111   RDW 12.7 02/03/2023 1111   LYMPHSABS 1.8 02/02/2018 0039   MONOABS 1.7 (H) 02/02/2018 0039   EOSABS 0.1 02/02/2018 0039   BASOSABS 0.1 02/02/2018 0039    Hgb A1C Lab Results  Component Value Date   HGBA1C 5.3 02/03/2023           Assessment & Plan:   Assessment and Plan    Weight management BMI 32.19. Interested in weight loss medication. Hyperlipidemia present. Does not  qualify for weight loss injections. Psychiatric medications contraindicate phentermine or Qsymia. Contrave possible with psychiatric clearance. Lithium may contribute to weight gain. - Submit prior authorization for Wegovy 0.25 mg injections with hyperlipidemia as primary diagnosis. - Discuss Contrave with psychiatrist for potential prescription. - Advise regular exercise and healthy diet.  Hyperlipidemia Factor in consideration for weight loss medication.  Lithium monitoring On lithium 300 mg BID for anxiety and depression. Requires regular monitoring of lithium levels and liver function. - Order lithium level and liver function tests. - Send results to psychiatrist for further management.        RTC in 4 months for your annual exam Angeline Laura, NP

## 2023-10-18 ENCOUNTER — Telehealth: Payer: Self-pay

## 2023-10-18 ENCOUNTER — Encounter: Payer: Self-pay | Admitting: Internal Medicine

## 2023-10-18 ENCOUNTER — Ambulatory Visit: Payer: Self-pay | Admitting: Internal Medicine

## 2023-10-18 LAB — HEPATIC FUNCTION PANEL
AG Ratio: 1.7 (calc) (ref 1.0–2.5)
ALT: 10 U/L (ref 6–29)
AST: 13 U/L (ref 10–30)
Albumin: 4.4 g/dL (ref 3.6–5.1)
Alkaline phosphatase (APISO): 81 U/L (ref 31–125)
Bilirubin, Direct: 0.1 mg/dL (ref 0.0–0.2)
Globulin: 2.6 g/dL (ref 1.9–3.7)
Indirect Bilirubin: 0.4 mg/dL (ref 0.2–1.2)
Total Bilirubin: 0.5 mg/dL (ref 0.2–1.2)
Total Protein: 7 g/dL (ref 6.1–8.1)

## 2023-10-18 LAB — LITHIUM LEVEL: Lithium Lvl: 0.6 mmol/L (ref 0.6–1.2)

## 2023-10-18 MED ORDER — CONTRAVE 8-90 MG PO TB12: ORAL_TABLET | ORAL | 0 refills | Status: DC

## 2023-10-18 MED ORDER — TRI-ESTARYLLA 0.18/0.215/0.25 MG-35 MCG PO TABS: 1.0000 | ORAL_TABLET | Freq: Every day | ORAL | 1 refills | Status: DC

## 2023-10-18 NOTE — Telephone Encounter (Signed)
 I have tried x 2 to send this medication electronically.  It continues to print.  Please fax

## 2023-10-23 ENCOUNTER — Telehealth: Payer: Self-pay

## 2023-10-23 NOTE — Telephone Encounter (Signed)
 Pharmacy Patient Advocate Encounter   Received notification from CoverMyMeds that prior authorization for Wegovy  0.25MG /0.5ML auto-injectors is required/requested.   Insurance verification completed.   The patient is insured through [facility] .   Per test claim: PA required; PA submitted to above mentioned insurance via CoverMyMeds Key/confirmation #/EOC [id number] Status is pending

## 2023-10-24 ENCOUNTER — Other Ambulatory Visit (HOSPITAL_COMMUNITY): Payer: Self-pay

## 2023-10-25 NOTE — Telephone Encounter (Signed)
 Pharmacy Patient Advocate Encounter  Received notification from [facility] that Prior Authorization for Wegovy  0.25MG /0.5ML auto-injectors has been APPROVED from 10/23/2023 to 05/20/2024   PA #/Case ID/Reference #:  [facility] [id number]

## 2023-10-27 ENCOUNTER — Other Ambulatory Visit: Payer: Self-pay | Admitting: Internal Medicine

## 2023-10-31 NOTE — Telephone Encounter (Signed)
 Requested Prescriptions  Pending Prescriptions Disp Refills   omeprazole  (PRILOSEC) 40 MG capsule [Pharmacy Med Name: OMEPRAZOLE  DR 40 MG CAPSULE] 90 capsule 0    Sig: TAKE 1 CAPSULE (40 MG TOTAL) BY MOUTH DAILY.     Gastroenterology: Proton Pump Inhibitors Passed - 10/31/2023 10:50 AM      Passed - Valid encounter within last 12 months    Recent Outpatient Visits           2 weeks ago Pure hypercholesterolemia   [facility] [place], Angeline ORN, NP   2 months ago Family history of systemic lupus erythematosus   [facility] [place], Angeline ORN, NP   11 years ago Pain of sternum   [facility], Harlene BROCKS, MD   12 years ago Impetigo   [facility] [place], Juliane, MD       Future Appointments             In 4 months Deveshwar, Maya, MD [facility] - [facility] Of [place]

## 2023-11-02 ENCOUNTER — Other Ambulatory Visit: Payer: Self-pay

## 2023-11-02 MED ORDER — CONTRAVE 8-90 MG PO TB12: ORAL_TABLET | ORAL | 0 refills | Status: DC

## 2023-11-08 ENCOUNTER — Other Ambulatory Visit (HOSPITAL_COMMUNITY): Payer: Self-pay

## 2023-11-09 ENCOUNTER — Other Ambulatory Visit (HOSPITAL_BASED_OUTPATIENT_CLINIC_OR_DEPARTMENT_OTHER): Payer: Self-pay

## 2023-11-09 ENCOUNTER — Telehealth: Payer: Self-pay

## 2023-11-09 ENCOUNTER — Other Ambulatory Visit (HOSPITAL_COMMUNITY): Payer: Self-pay

## 2023-11-09 NOTE — Telephone Encounter (Signed)
 Pharmacy Patient Advocate Encounter   Received notification from Physician's Office that prior authorization for Contrave  8-90MG  er tablets is required/requested.   Insurance verification completed.   The patient is insured through [facility] [employer] .   Per test claim: PA required and submitted KEY/EOC/Request #: [id number] from 11/09/2023 to 03/08/2024

## 2023-11-09 NOTE — Telephone Encounter (Signed)
 I got the contrave  PA approved today

## 2023-11-09 NOTE — Telephone Encounter (Signed)
 I see where we have gotten a PA for Wegovy  approved, I can submit a PA for the contrave  though if needed.

## 2023-11-14 NOTE — Addendum Note (Signed)
 Addended by: ANTONETTE ANGELINE ORN on: 11/14/2023 11:59 AM   Modules accepted: Orders

## 2023-11-22 MED ORDER — PHENTERMINE HCL 15 MG PO CAPS
15.0000 mg | ORAL_CAPSULE | ORAL | 0 refills | Status: DC
Start: 2023-11-22 — End: 2023-11-23

## 2023-11-22 NOTE — Addendum Note (Signed)
 Addended by: ANTONETTE ANGELINE ORN on: 11/22/2023 08:07 AM   Modules accepted: Orders

## 2023-11-23 MED ORDER — PHENTERMINE HCL 15 MG PO CAPS: 15.0000 mg | ORAL_CAPSULE | ORAL | 0 refills | Status: DC

## 2023-11-23 NOTE — Addendum Note (Signed)
 Addended by: ANTONETTE ANGELINE ORN on: 11/23/2023 11:02 AM   Modules accepted: Orders

## 2023-11-27 ENCOUNTER — Other Ambulatory Visit: Payer: Self-pay

## 2023-11-27 MED ORDER — CLOBETASOL PROPIONATE 0.05 % EX SOLN: 1.0000 | CUTANEOUS | 0 refills | Status: AC

## 2023-12-07 ENCOUNTER — Encounter: Payer: Self-pay | Admitting: Internal Medicine

## 2023-12-07 NOTE — Telephone Encounter (Signed)
 This was checked 07/2022 and was normal.  If she would like this rechecked, she will need an appointment to discuss and order labs

## 2023-12-26 ENCOUNTER — Encounter: Payer: Self-pay | Admitting: Internal Medicine

## 2023-12-29 ENCOUNTER — Encounter: Payer: Self-pay | Admitting: Internal Medicine

## 2024-01-02 ENCOUNTER — Ambulatory Visit (INDEPENDENT_AMBULATORY_CARE_PROVIDER_SITE_OTHER): Admitting: Internal Medicine

## 2024-01-02 ENCOUNTER — Encounter: Payer: Self-pay | Admitting: Internal Medicine

## 2024-01-02 VITALS — BP 122/78 | Ht <= 58 in | Wt 163.0 lb

## 2024-01-02 DIAGNOSIS — R5382 Chronic fatigue, unspecified: Secondary | ICD-10-CM

## 2024-01-02 DIAGNOSIS — E66811 Obesity, class 1: Secondary | ICD-10-CM | POA: Diagnosis not present

## 2024-01-02 DIAGNOSIS — E559 Vitamin D deficiency, unspecified: Secondary | ICD-10-CM

## 2024-01-02 DIAGNOSIS — E6609 Other obesity due to excess calories: Secondary | ICD-10-CM

## 2024-01-02 DIAGNOSIS — E538 Deficiency of other specified B group vitamins: Secondary | ICD-10-CM

## 2024-01-02 DIAGNOSIS — D511 Vitamin B12 deficiency anemia due to selective vitamin B12 malabsorption with proteinuria: Secondary | ICD-10-CM

## 2024-01-02 DIAGNOSIS — R0683 Snoring: Secondary | ICD-10-CM

## 2024-01-02 DIAGNOSIS — F32A Depression, unspecified: Secondary | ICD-10-CM

## 2024-01-02 DIAGNOSIS — Z6834 Body mass index (BMI) 34.0-34.9, adult: Secondary | ICD-10-CM

## 2024-01-02 DIAGNOSIS — F5104 Psychophysiologic insomnia: Secondary | ICD-10-CM

## 2024-01-02 DIAGNOSIS — F419 Anxiety disorder, unspecified: Secondary | ICD-10-CM

## 2024-01-02 LAB — VITAMIN D 25 HYDROXY (VIT D DEFICIENCY, FRACTURES): Vit D, 25-Hydroxy: 30 ng/mL (ref 30–100)

## 2024-01-02 LAB — CBC
HCT: 38.6 % (ref 35.0–45.0)
Hemoglobin: 12.8 g/dL (ref 11.7–15.5)
MCH: 29 pg (ref 27.0–33.0)
MCHC: 33.2 g/dL (ref 32.0–36.0)
MCV: 87.5 fL (ref 80.0–100.0)
MPV: 9.6 fL (ref 7.5–12.5)
Platelets: 322 Thousand/uL (ref 140–400)
RBC: 4.41 Million/uL (ref 3.80–5.10)
RDW: 12.3 % (ref 11.0–15.0)
WBC: 11.6 Thousand/uL — ABNORMAL HIGH (ref 3.8–10.8)

## 2024-01-02 LAB — TSH: TSH: 2.74 m[IU]/L

## 2024-01-02 LAB — VITAMIN B12: Vitamin B-12: 323 pg/mL (ref 200–1100)

## 2024-01-02 NOTE — Patient Instructions (Signed)
 Chronic Fatigue Syndrome Chronic fatigue syndrome (CFS) is a condition that causes extreme tiredness (fatigue). This condition is also known as myalgic encephalomyelitis (ME). The fatigue in CFS does not improve with rest, and it gets worse with physical or mental activity. Several other symptoms may occur along with fatigue. Symptoms may come and go, but they generally last for months. Sometimes, CFS gets better over time. In other cases, it can be a lifelong condition. There is no cure, but there are many possible treatments. You will need to work with your health care providers to find a treatment plan that works best for you. What are the causes? The cause of this condition is not known. CFS may be caused by a combination of things. Possible causes include: An infection. An abnormal body defense system (abnormal immune system). Changes in how your body produces energy. Genetic factors. Physical or emotional stress. Having a poor diet. What increases the risk? You are more likely to develop this condition if: You are female. You are a young adult or middle-aged adult. You have a family history of CFS. What are the signs or symptoms? The main symptom of this condition is fatigue that is severe enough to interfere with day-to-day activities. This fatigue does not get better with rest, and it gets worse with physical or mental activity. There are eight other major symptoms of CFS: Discomfort and lack of energy (malaise) that lasts more than 24 hours after physical activity. Sleep that does not relieve fatigue (unrefreshing sleep). Short-term memory loss or confusion. Joint pain without redness or swelling. Muscle aches. Headaches. Painful and swollen glands (lymph nodes) in the neck or under the arms. Sore throat. Other symptoms can include: Cramps in the abdomen, constipation, or diarrhea (irritable bowel syndrome). Chills and night sweats. Vision changes. Dizziness and mental  confusion (brain fog). Clumsiness. Sensitivity to food, noise, or odors. Mood swings, depression, or anxiety attacks. How is this diagnosed? There are no tests that can diagnose this condition. Your health care provider will make the diagnosis based on: Your symptoms and medical history. A physical exam and a mental health exam. Tests to rule out other conditions. It is important to make sure that your symptoms are not caused by another medical condition. Tests may include lab tests or X-rays. For your health care provider to diagnose CFS: You must have had fatigue for at least 6 months in a row. Fatigue must be your first symptom, and it must be severe enough to interfere with day-to-day activities. You must also have at least four of the eight other major symptoms of CFS. There must be no other cause found for the fatigue. How is this treated? There is no cure for CFS. The condition affects everyone differently. You will need to work with your team of health care providers to find the best treatments for your symptoms. Your team may include your primary care provider, physical and exercise therapists, and mental health therapists. Treatment may include: Having a regular bedtime routine to help improve your sleep. Avoiding caffeine and alcohol. Doing light exercise and stretching during the day. You may also want to try movement exercises, such as yoga or tai chi. Taking medicines to help you sleep or to relieve joint or muscle pain. Learning and practicing relaxation techniques, such as deep breathing and muscle relaxation. Using memory aids or doing puzzles to improve memory and concentration. Getting care for your body and mental well-being, such as: Seeing a mental health therapist to evaluate and  treat depression, if necessary. Cognitive behavioral therapy (CBT). This therapy changes the way you think or act in response to the fatigue. This may help improve how you feel. Trying massage  therapy and acupuncture. Follow these instructions at home: Eating and drinking  Avoid caffeine and alcohol. Avoid heavy meals in the evening. Eat a healthy diet that includes foods such as vegetables, fruits, fish, and lean meats. Activity Rest as told by your health care provider. Avoid fatigue by pacing yourself during the day and getting enough sleep at night. Exercise as told by your health care provider. Go to bed and get up at the same time every day. Lifestyle Ask your health care provider whether you should keep a journal. Your health care provider will tell you what information to write in the journal. This may include when you have fatigue and how medicines and other behaviors or treatments help to reduce the fatigue. Consider joining a CFS support group. Avoid stress, and use stress-reducing techniques that you learn in therapy. General instructions Take over-the-counter and prescription medicines only as told by your health care provider. Do not use herbal or dietary supplements unless they are approved by your health care provider. Maintain a healthy weight. Keep all follow-up visits. This is important. Where to find more information Get more information or find a support group near you at one of these links: American Myalgic Encephalomyelitis and Chronic Fatigue Syndrome Society: ammes.org Centers for Disease Control and Prevention: FootballExhibition.com.br Contact a health care provider if: Your symptoms do not get better or they get worse. You feel angry, guilty, anxious, or depressed. Get help right away if: You have thoughts about hurting yourself or others. Get help right away if you feel like you may hurt yourself or others, or have thoughts about taking your own life. Go to your nearest emergency room or: Call 911. Call the National Suicide Prevention Lifeline at 801-146-7745 or 988. This is open 24 hours a day. Text the Crisis Text Line at 754-243-9013. Summary Chronic  fatigue syndrome (CFS) is a condition that causes extreme tiredness (fatigue). This fatigue does not improve with rest, and it gets worse with physical or mental activity. There is no cure for CFS. The condition affects everyone differently. You will need to work with your team of health care providers to find the best treatments for your symptoms. Avoid stress, and use stress-reducing techniques that you learn in therapy. Contact a health care provider if your symptoms do not get better or they get worse. This information is not intended to replace advice given to you by your health care provider. Make sure you discuss any questions you have with your health care provider. Document Revised: 02/01/2021 Document Reviewed: 02/01/2021 Elsevier Patient Education  2024 [employer].

## 2024-01-02 NOTE — Progress Notes (Signed)
 Subjective:    Patient ID: Michele Dunlap, female    DOB: 26-Nov-2000, 23 y.o.   MRN: [id number]  HPI Discussed the use of AI scribe software for clinical note transcription with the patient, who gave verbal consent to proceed.  Michele Dunlap is a 23 year old female who presents with chronic fatigue.  She experiences persistent fatigue that has worsened over the past few months. Despite adjustments to her medications, including discontinuing seroquel and reducing then stopping lithium , her fatigue persists. She does not feel rested upon waking and experiences sleep disturbances, including snoring and daytime napping. Despite taking Adderall, she continues to feel tired.  Her physical activity is limited to walking to and from classes, which sometimes leaves her out of breath. She does not engage in structured exercise. The patient is not aware of any history of anemia.  She indicates a high chance of dozing in situations such as sitting and reading, watching television, and lying down in the afternoon to rest. She reports a moderate chance of dozing when sitting in a car stopped at a stoplight.       Review of Systems     Past Medical History:  Diagnosis Date   Allergy    Complication of anesthesia    Started to wake during EGD (age 89)   Migraine    couple times per week    Current Outpatient Medications  Medication Sig Dispense Refill   amphetamine-dextroamphetamine (ADDERALL XR) 15 MG 24 hr capsule Take by mouth every morning.     citalopram (CELEXA) 20 MG tablet Take 20 mg by mouth daily.     clobetasol  (TEMOVATE ) 0.05 % external solution Apply 1 Application topically 2 (two) times a week. 240 mL 0   CONTRAVE  8-90 MG TB12 Week 1: Take 1 tab daily with breakfast. Week 2: Take 1 tab twice daily with meal. Week 3: Take 2 tab with AM meal and 1 tab with PM meal. Week 4+: Take 2 tabs twice daily with meals - continue for weight loss 120 tablet 0   lithium  300 MG tablet Take 300  mg by mouth 2 (two) times daily.     Lurasidone HCl 60 MG TABS 60 mg.     omeprazole  (PRILOSEC) 40 MG capsule TAKE 1 CAPSULE (40 MG TOTAL) BY MOUTH DAILY. 90 capsule 0   ondansetron  (ZOFRAN -ODT) 4 MG disintegrating tablet Take 1 tablet (4 mg total) by mouth every 8 (eight) hours as needed for nausea or vomiting. 20 tablet 0   phentermine  15 MG capsule Take 1 capsule (15 mg total) by mouth every morning. 30 capsule 0   rizatriptan (MAXALT) 10 MG tablet Take by mouth.     TRI-ESTARYLLA  0.18/0.215/0.25 MG-35 MCG tablet Take 1 tablet by mouth daily. 84 tablet 1   No current facility-administered medications for this visit.    Allergies  Allergen Reactions   Iodinated Contrast Media Nausea Only   Multihance  [Gadobenate] Nausea And Vomiting    Family History  Problem Relation Age of Onset   Hypertension Mother    Healthy Father    Down syndrome Sister    Hypertension Maternal Grandmother    Hypertension Maternal Grandfather    Hypertension Paternal Grandmother    Hypertension Paternal Grandfather     Social History   Socioeconomic History   Marital status: Single    Spouse name: Not on file   Number of children: Not on file   Years of education: Not on file   Highest  education level: Not on file  Occupational History   Not on file  Tobacco Use   Smoking status: Never   Smokeless tobacco: Never  Vaping Use   Vaping status: Every Day  Substance and Sexual Activity   Alcohol use: Never   Drug use: Never   Sexual activity: Yes  Other Topics Concern   Not on file  Social History Narrative   Sri is currently in 11th grade at [employer]. She lives at home with mother and sister. She has 3 dogs. She likes to dance.   Social Drivers of Corporate investment banker Strain: Not on file  Food Insecurity: Not on file  Transportation Needs: Not on file  Physical Activity: Not on file  Stress: Not on file  Social Connections: Not on file  Intimate Partner Violence: Not on  file     Constitutional: Patient reports intermittent headaches, abnormal weight gain.  Denies fever, malaise, fatigue.  HEENT: Denies eye pain, eye redness, ear pain, ringing in the ears, wax buildup, runny nose, nasal congestion, bloody nose, or sore throat. Respiratory: Denies difficulty breathing, shortness of breath, cough or sputum production.   Cardiovascular: Denies chest pain, chest tightness, palpitations or swelling in the hands or feet.  Gastrointestinal: Denies abdominal pain, bloating, constipation, diarrhea or blood in the stool.  GU: Denies urgency, frequency, pain with urination, burning sensation, blood in urine, odor or discharge. Musculoskeletal: Denies decrease in range of motion, difficulty with gait, muscle pain or joint pain and swelling.  Skin: Denies redness, rashes, lesions or ulcercations.  Neurological: Patient reports inattention, insomnia.  Denies dizziness, difficulty with memory, difficulty with speech or problems with balance and coordination.  Psych: Patient has a history of anxiety and depression.  Denies SI/HI.  No other specific complaints in a complete review of systems (except as listed in HPI above).  Objective:   Physical Exam BP 122/78 (BP Location: Left Arm, Patient Position: Sitting, Cuff Size: Normal)   Ht 4' 10 (1.473 m)   Wt 163 lb (73.9 kg)   LMP 12/26/2023 (Approximate)   BMI 34.07 kg/m     Wt Readings from Last 3 Encounters:  10/17/23 154 lb (69.9 kg)  08/04/23 146 lb 3.2 oz (66.3 kg)  05/15/23 142 lb (64.4 kg)    General: Appears her stated age, obese, in NAD. Skin: Warm, dry and intact.  Cardiovascular: Normal rate.  Pulmonary/Chest: Normal effort. Musculoskeletal: No difficulty with gait.  Neurological: Alert and oriented. Coordination normal.  Psychiatric: Mood and affect flat. Behavior is normal. Judgment and thought content normal.    BMET    Component Value Date/Time   NA 138 02/03/2023 1111   K 3.9 02/03/2023  1111   CL 106 02/03/2023 1111   CO2 22 02/03/2023 1111   GLUCOSE 88 02/03/2023 1111   BUN 11 02/03/2023 1111   CREATININE 0.82 02/03/2023 1111   CALCIUM 9.7 02/03/2023 1111   GFRNONAA NOT CALCULATED 02/02/2018 0039   GFRAA NOT CALCULATED 02/02/2018 0039    Lipid Panel     Component Value Date/Time   CHOL 191 02/03/2023 1111   TRIG 183 (H) 02/03/2023 1111   HDL 51 02/03/2023 1111   CHOLHDL 3.7 02/03/2023 1111   LDLCALC 110 (H) 02/03/2023 1111    CBC    Component Value Date/Time   WBC 10.6 02/03/2023 1111   RBC 4.98 02/03/2023 1111   HGB 14.4 02/03/2023 1111   HCT 43.7 02/03/2023 1111   PLT 320 02/03/2023 1111  MCV 87.8 02/03/2023 1111   MCH 28.9 02/03/2023 1111   MCHC 33.0 02/03/2023 1111   RDW 12.7 02/03/2023 1111   LYMPHSABS 1.8 02/02/2018 0039   MONOABS 1.7 (H) 02/02/2018 0039   EOSABS 0.1 02/02/2018 0039   BASOSABS 0.1 02/02/2018 0039    Hgb A1C Lab Results  Component Value Date   HGBA1C 5.3 02/03/2023           Assessment & Plan:   Assessment and Plan    Chronic fatigue Chronic fatigue persists, worsening recently. Differential includes sleep apnea, medication side effects, and anxiety or depression. Current medications control anxiety and depression. Thyroid  function normal in 2023. - Order home sleep study to rule out sleep apnea. - Discuss with psychiatrist about restarting Seroquel if sleep improves.  Suspected obstructive sleep apnea High Epworth Sleepiness Scale score and BMI of 34.07 suggest increased risk for obstructive sleep apnea. - Order home sleep study to evaluate for obstructive sleep apnea.  Insomnia Insomnia persists despite previous Seroquel use, which improved sleep but not fatigue. - Discuss with psychiatrist about restarting Seroquel for insomnia.  Obesity, class 1 BMI of 34.07 indicates class 1 obesity, contributing to sleep apnea risk. Current activity not aerobic. - Recommend aerobic exercise to elevate heart rate above  150 bpm for 30 minutes to improve energy and reduce stress.  Anxiety and depression, controlled Anxiety and depression well-controlled with current medication. Fatigue not attributed to these conditions. - Continue current management for anxiety and depression.        RTC in 1 months for your annual exam Angeline Laura, NP

## 2024-01-03 ENCOUNTER — Ambulatory Visit: Payer: Self-pay | Admitting: Internal Medicine

## 2024-01-19 NOTE — Progress Notes (Deleted)
 Office Visit Note  Patient: Michele Dunlap             Date of Birth: 07/11/2000           MRN: [id number]             PCP: Antonette Angeline ORN, NP Referring: Antonette Angeline ORN, NP Visit Date: 02/02/2024 Occupation: Data Unavailable  Subjective:  No chief complaint on file.   History of Present Illness: Michele Dunlap is a 23 y.o. female ***     Activities of Daily Living:  Patient reports morning stiffness for *** {minute/hour:19697}.   Patient {ACTIONS;DENIES/REPORTS:21021675::Denies} nocturnal pain.  Difficulty dressing/grooming: {ACTIONS;DENIES/REPORTS:21021675::Denies} Difficulty climbing stairs: {ACTIONS;DENIES/REPORTS:21021675::Denies} Difficulty getting out of chair: {ACTIONS;DENIES/REPORTS:21021675::Denies} Difficulty using hands for taps, buttons, cutlery, and/or writing: {ACTIONS;DENIES/REPORTS:21021675::Denies}  No Rheumatology ROS completed.   PMFS History:  Patient Active Problem List   Diagnosis Date Noted   Class 1 obesity due to excess calories with serious comorbidity and body mass index (BMI) of 32.0 to 32.9 in adult 10/17/2023   ADD (attention deficit disorder) 08/18/2022   Eczema 08/18/2022   Idiopathic neuropathy 06/02/2022   Pure hypercholesterolemia 06/02/2022   Insomnia 01/10/2022   Anxiety and depression 03/18/2020   Eosinophilic esophagitis 02/12/2018   Migraine 02/27/2012    Past Medical History:  Diagnosis Date   Allergy    Complication of anesthesia    Started to wake during EGD (age 67)   Migraine    couple times per week    Family History  Problem Relation Age of Onset   Hypertension Mother    Healthy Father    Down syndrome Sister    Hypertension Maternal Grandmother    Hypertension Maternal Grandfather    Hypertension Paternal Grandmother    Hypertension Paternal Grandfather    Past Surgical History:  Procedure Laterality Date   ESOPHAGOGASTRODUODENOSCOPY  03/15/2018   [facility]   ESOPHAGOGASTRODUODENOSCOPY (EGD) WITH  PROPOFOL  N/A 04/29/2022   Procedure: ESOPHAGOGASTRODUODENOSCOPY (EGD) WITH BIOPSY AND DILATION;  Surgeon: Jinny Carmine, MD;  Location: [facility];  Service: Endoscopy;  Laterality: N/A;  DILATION 12-15MM   ESOPHAGOGASTRODUODENOSCOPY (EGD) WITH PROPOFOL  N/A 06/16/2022   Procedure: ESOPHAGOGASTRODUODENOSCOPY (EGD) WITH PROPOFOL ;  Surgeon: Jinny Carmine, MD;  Location: [facility];  Service: Endoscopy;  Laterality: N/A;   IR FL GUIDED LOC OF NEEDLE/CATH TIP FOR SPINAL INJECTION LT  03/31/2022   Social History   Tobacco Use   Smoking status: Never   Smokeless tobacco: Never  Vaping Use   Vaping status: Every Day  Substance Use Topics   Alcohol use: Never   Drug use: Never   Social History   Social History Narrative   Michele Dunlap is currently in 11th grade at [employer]. She lives at home with mother and sister. She has 3 dogs. She likes to dance.     Immunization History  Administered Date(s) Administered   DTaP 12/07/2001, 02/07/2002, 04/09/2002, 02/06/2003, 10/28/2005   Fluzone Influenza virus vaccine,trivalent (IIV3), split virus 02/06/2003, 02/02/2010, 12/30/2012, 01/13/2016   HIB (PRP-OMP) 12/07/2001, 02/07/2002, 04/09/2002, 02/06/2003   HPV 9-valent 07/11/2019   Hepatitis A, Ped/Adol-2 Dose 10/28/2005, 11/21/2006   Hepatitis B, PED/ADOLESCENT 2002/03/12, 10/31/2001, 07/11/2002   Hepb-cpg 07/29/2023   Hpv-Unspecified 11/13/2013, 01/17/2014   IPV 12/07/2001, 02/07/2002, 07/11/2002, 10/28/2005   Influenza Inj Mdck Quad Pf 12/14/2017   Influenza Nasal 01/10/2011, 01/17/2014   Influenza,inj,Quad PF,6+ Mos 12/03/2016   MMR 10/17/2002, 10/28/2005   Meningococcal B, OMV 07/11/2019   Meningococcal Conjugate 11/13/2013, 07/11/2019   Pneumococcal Conjugate PCV  7 12/07/2001, 02/07/2002, 04/09/2002, 02/06/2003   Pneumococcal Conjugate-13 11/13/2013   Tdap 09/06/2012, 02/03/2023, 07/29/2023   Varicella 10/17/2002, 10/28/2005     Objective: Vital Signs: There were no vitals  taken for this visit.   Physical Exam   Musculoskeletal Exam: ***  CDAI Exam: CDAI Score: -- Patient Global: --; Provider Global: -- Swollen: --; Tender: -- Joint Exam 02/02/2024   No joint exam has been documented for this visit   There is currently no information documented on the homunculus. Go to the Rheumatology activity and complete the homunculus joint exam.  Investigation: No additional findings.  Imaging: No results found.  Recent Labs: Lab Results  Component Value Date   WBC 11.6 (H) 01/02/2024   HGB 12.8 01/02/2024   PLT 322 01/02/2024   NA 138 02/03/2023   K 3.9 02/03/2023   CL 106 02/03/2023   CO2 22 02/03/2023   GLUCOSE 88 02/03/2023   BUN 11 02/03/2023   CREATININE 0.82 02/03/2023   BILITOT 0.5 10/17/2023   AST 13 10/17/2023   ALT 10 10/17/2023   PROT 7.0 10/17/2023   CALCIUM 9.7 02/03/2023   GFRAA NOT CALCULATED 02/02/2018   August 04, 2023 ANA negative,Sed rate 2, CRP 5.6 January 02, 2024 TSH normal, B12 323, vitamin D  30 Speciality Comments: No specialty comments available.  Procedures:  No procedures performed Allergies: Iodinated contrast media and Multihance  [gadobenate]   Assessment / Plan:     Visit Diagnoses: No diagnosis found.  Orders: No orders of the defined types were placed in this encounter.  No orders of the defined types were placed in this encounter.   Face-to-face time spent with patient was *** minutes. Greater than 50% of time was spent in counseling and coordination of care.  Follow-Up Instructions: No follow-ups on file.   Maya Nash, MD  Note - This record has been created using Animal nutritionist.  Chart creation errors have been sought, but may not always  have been located. Such creation errors do not reflect on  the standard of medical care.

## 2024-02-02 ENCOUNTER — Encounter: Admitting: Rheumatology

## 2024-02-02 DIAGNOSIS — L308 Other specified dermatitis: Secondary | ICD-10-CM

## 2024-02-02 DIAGNOSIS — F32A Depression, unspecified: Secondary | ICD-10-CM

## 2024-02-02 DIAGNOSIS — R41840 Attention and concentration deficit: Secondary | ICD-10-CM

## 2024-02-02 DIAGNOSIS — M2550 Pain in unspecified joint: Secondary | ICD-10-CM

## 2024-02-02 DIAGNOSIS — Z832 Family history of diseases of the blood and blood-forming organs and certain disorders involving the immune mechanism: Secondary | ICD-10-CM

## 2024-02-02 DIAGNOSIS — K200 Eosinophilic esophagitis: Secondary | ICD-10-CM

## 2024-02-02 DIAGNOSIS — G4709 Other insomnia: Secondary | ICD-10-CM

## 2024-02-02 DIAGNOSIS — Z8269 Family history of other diseases of the musculoskeletal system and connective tissue: Secondary | ICD-10-CM

## 2024-02-02 DIAGNOSIS — E7800 Pure hypercholesterolemia, unspecified: Secondary | ICD-10-CM

## 2024-02-02 DIAGNOSIS — G609 Hereditary and idiopathic neuropathy, unspecified: Secondary | ICD-10-CM

## 2024-02-02 DIAGNOSIS — E66811 Obesity, class 1: Secondary | ICD-10-CM

## 2024-02-05 ENCOUNTER — Encounter: Admitting: Internal Medicine

## 2024-02-05 ENCOUNTER — Encounter: Payer: Self-pay | Admitting: Internal Medicine

## 2024-02-05 NOTE — Progress Notes (Deleted)
 Subjective:    Patient ID: Michele Dunlap, female    DOB: 12/11/2000, 23 y.o.   MRN: [id number]  HPI  Patient presents to clinic today for her annual exam.  Flu: 01/2023 Tetanus: 07/2023 COVID: x 1 Pap smear: 01/2023 Dentist: biannually  Diet: She does eat meat. She consumes fruits and veggies. She does eat fried foods. She drinks mostly water , soda, energy drinks. Exercise: Walking  Review of Systems  Past Medical History:  Diagnosis Date   Allergy    Complication of anesthesia    Started to wake during EGD (age 21)   Migraine    couple times per week    Current Outpatient Medications  Medication Sig Dispense Refill   amphetamine-dextroamphetamine (ADDERALL XR) 15 MG 24 hr capsule Take by mouth every morning.     clobetasol  (TEMOVATE ) 0.05 % external solution Apply 1 Application topically 2 (two) times a week. 240 mL 0   lithium  300 MG tablet Take 300 mg by mouth 2 (two) times daily.     lurasidone (LATUDA) 40 MG TABS tablet SMARTSIG:1 Tablet(s) By Mouth Every Evening     Lurasidone HCl 60 MG TABS 60 mg.     omeprazole  (PRILOSEC) 40 MG capsule TAKE 1 CAPSULE (40 MG TOTAL) BY MOUTH DAILY. 90 capsule 0   ondansetron  (ZOFRAN -ODT) 4 MG disintegrating tablet Take 1 tablet (4 mg total) by mouth every 8 (eight) hours as needed for nausea or vomiting. 20 tablet 0   rizatriptan (MAXALT) 10 MG tablet Take by mouth.     TRI-ESTARYLLA  0.18/0.215/0.25 MG-35 MCG tablet Take 1 tablet by mouth daily. 84 tablet 1   No current facility-administered medications for this visit.    Allergies  Allergen Reactions   Iodinated Contrast Media Nausea Only   Multihance  [Gadobenate] Nausea And Vomiting    Family History  Problem Relation Age of Onset   Hypertension Mother    Healthy Father    Down syndrome Sister    Hypertension Maternal Grandmother    Hypertension Maternal Grandfather    Hypertension Paternal Grandmother    Hypertension Paternal Grandfather     Social History    Socioeconomic History   Marital status: Single    Spouse name: Not on file   Number of children: Not on file   Years of education: Not on file   Highest education level: Not on file  Occupational History   Not on file  Tobacco Use   Smoking status: Never   Smokeless tobacco: Never  Vaping Use   Vaping status: Every Day  Substance and Sexual Activity   Alcohol use: Never   Drug use: Never   Sexual activity: Yes  Other Topics Concern   Not on file  Social History Narrative   Michele Dunlap is currently in 11th grade at [employer]. She lives at home with mother and sister. She has 3 dogs. She likes to dance.   Social Drivers of Corporate investment banker Strain: Not on file  Food Insecurity: Not on file  Transportation Needs: Not on file  Physical Activity: Not on file  Stress: Not on file  Social Connections: Not on file  Intimate Partner Violence: Not on file     Constitutional: Patient reports intermittent headaches.  Denies fever, malaise, fatigue, or abrupt weight changes.  HEENT: Denies eye pain, eye redness, ear pain, ringing in the ears, wax buildup, runny nose, nasal congestion, bloody nose, or sore throat. Respiratory: Denies difficulty breathing, shortness of breath, cough or sputum  production.   Cardiovascular: Denies chest pain, chest tightness, palpitations or swelling in the hands or feet.  Gastrointestinal: Patient reports constipation.  Denies abdominal pain, bloating, diarrhea or blood in the stool.  GU: Denies urgency, frequency, pain with urination, burning sensation, blood in urine, odor or discharge. Musculoskeletal: Denies decrease in range of motion, difficulty with gait, muscle pain or joint pain and swelling.  Skin: Denies redness, rashes, lesions or ulcercations.  Neurological: Patient reports inattention, insomnia and neuropathy.  Denies dizziness, difficulty with memory, difficulty with speech or problems with balance and coordination.  Psych:  Patient has a history of anxiety and depression.  Denies SI/HI.  No other specific complaints in a complete review of systems (except as listed in HPI above).     Objective:   Physical Exam   There were no vitals taken for this visit.  Wt Readings from Last 3 Encounters:  01/02/24 163 lb (73.9 kg)  10/17/23 154 lb (69.9 kg)  08/04/23 146 lb 3.2 oz (66.3 kg)    General: Appears her stated age, obese, in NAD. Skin: Warm, dry and intact. HEENT: Head: normal shape and size; Eyes: sclera white, no icterus, conjunctiva pink, PERRLA and EOMs intact;  Neck:  Neck supple, trachea midline. No masses, lumps or thyromegaly present.  Cardiovascular: Normal rate and rhythm. S1,S2 noted.  No murmur, rubs or gallops noted. No JVD or BLE edema.  Pulmonary/Chest: Normal effort and positive vesicular breath sounds. No respiratory distress. No wheezes, rales or ronchi noted.  Abdomen: Soft and nontender. Normal bowel sounds. No distention or masses noted. Liver, spleen and kidneys non palpable. Pelvic: Normal female anatomy. Cervix without mass or lesion. No CMT. Adnexa nonpalpable. Musculoskeletal: Strength 5/5 BUE/BLE. No difficulty with gait.  Neurological: Alert and oriented. Cranial nerves II-XII grossly intact. Coordination normal.  Psychiatric: Mood and affect normal. Behavior is normal. Judgment and thought content normal.    BMET    Component Value Date/Time   NA 138 02/03/2023 1111   K 3.9 02/03/2023 1111   CL 106 02/03/2023 1111   CO2 22 02/03/2023 1111   GLUCOSE 88 02/03/2023 1111   BUN 11 02/03/2023 1111   CREATININE 0.82 02/03/2023 1111   CALCIUM 9.7 02/03/2023 1111   GFRNONAA NOT CALCULATED 02/02/2018 0039   GFRAA NOT CALCULATED 02/02/2018 0039    Lipid Panel     Component Value Date/Time   CHOL 191 02/03/2023 1111   TRIG 183 (H) 02/03/2023 1111   HDL 51 02/03/2023 1111   CHOLHDL 3.7 02/03/2023 1111   LDLCALC 110 (H) 02/03/2023 1111    CBC    Component Value  Date/Time   WBC 11.6 (H) 01/02/2024 1447   RBC 4.41 01/02/2024 1447   HGB 12.8 01/02/2024 1447   HCT 38.6 01/02/2024 1447   PLT 322 01/02/2024 1447   MCV 87.5 01/02/2024 1447   MCH 29.0 01/02/2024 1447   MCHC 33.2 01/02/2024 1447   RDW 12.3 01/02/2024 1447   LYMPHSABS 1.8 02/02/2018 0039   MONOABS 1.7 (H) 02/02/2018 0039   EOSABS 0.1 02/02/2018 0039   BASOSABS 0.1 02/02/2018 0039    Hgb A1C Lab Results  Component Value Date   HGBA1C 5.3 02/03/2023           Assessment & Plan:   Preventative health maintenance:  Flu shot  Tetanus UTD EncouragUTD Encouraged her to consume a balanced diet and exercise regimen Advised her to see a dentist annually We will check CBC, c-Met, lipid, A1c today  RTC in  6 months, follow-up chronic conditions Angeline Laura, NP

## 2024-02-21 ENCOUNTER — Encounter: Payer: Self-pay | Admitting: Internal Medicine

## 2024-02-23 NOTE — Telephone Encounter (Signed)
 This is something I will consider however I would need to see her in person.  I do see that she no-showed her physical appointment for October 13.  Lets have her reschedule this for sometime in November and we can discuss the acne at that time.

## 2024-02-27 ENCOUNTER — Ambulatory Visit (INDEPENDENT_AMBULATORY_CARE_PROVIDER_SITE_OTHER): Admitting: Internal Medicine

## 2024-02-27 ENCOUNTER — Encounter: Payer: Self-pay | Admitting: Internal Medicine

## 2024-02-27 VITALS — BP 124/78 | Ht <= 58 in | Wt 159.2 lb

## 2024-02-27 DIAGNOSIS — Z0001 Encounter for general adult medical examination with abnormal findings: Secondary | ICD-10-CM | POA: Diagnosis not present

## 2024-02-27 DIAGNOSIS — E66811 Other obesity due to excess calories: Secondary | ICD-10-CM

## 2024-02-27 DIAGNOSIS — L700 Acne vulgaris: Secondary | ICD-10-CM | POA: Diagnosis not present

## 2024-02-27 DIAGNOSIS — E6609 Other obesity due to excess calories: Secondary | ICD-10-CM | POA: Diagnosis not present

## 2024-02-27 DIAGNOSIS — Z6833 Body mass index (BMI) 33.0-33.9, adult: Secondary | ICD-10-CM

## 2024-02-27 MED ORDER — MINOCYCLINE HCL 100 MG PO CAPS: 100.0000 mg | ORAL_CAPSULE | Freq: Every day | ORAL | 1 refills | Status: AC

## 2024-02-27 MED ORDER — CLINDAMYCIN PHOS-BENZOYL PEROX 1-5 % EX GEL: Freq: Two times a day (BID) | CUTANEOUS | 0 refills | Status: DC

## 2024-02-27 NOTE — Assessment & Plan Note (Signed)
 Encouraged diet and exercise for weight loss ?

## 2024-02-27 NOTE — Patient Instructions (Signed)

## 2024-02-27 NOTE — Progress Notes (Signed)
 Subjective:    Patient ID: Michele Dunlap, female    DOB: June 20, 2000, 23 y.o.   MRN: [id number]  HPI  Patient presents to clinic today for her annual exam.  Flu: 01/2023 Tetanus: 07/2023 COVID: x 1 Pap smear: 01/2023 Dentist: biannually  Diet: She does eat meat. She consumes fruits and veggies. She does eat fried foods. She drinks mostly water , soda, energy drinks. Exercise: Walking  Review of Systems  Past Medical History:  Diagnosis Date   Allergy    Complication of anesthesia    Started to wake during EGD (age 25)   Migraine    couple times per week    Current Outpatient Medications  Medication Sig Dispense Refill   amphetamine-dextroamphetamine (ADDERALL XR) 15 MG 24 hr capsule Take by mouth every morning.     clobetasol  (TEMOVATE ) 0.05 % external solution Apply 1 Application topically 2 (two) times a week. 240 mL 0   lithium  300 MG tablet Take 300 mg by mouth 2 (two) times daily.     lurasidone (LATUDA) 40 MG TABS tablet SMARTSIG:1 Tablet(s) By Mouth Every Evening     Lurasidone HCl 60 MG TABS 60 mg.     omeprazole  (PRILOSEC) 40 MG capsule TAKE 1 CAPSULE (40 MG TOTAL) BY MOUTH DAILY. 90 capsule 0   ondansetron  (ZOFRAN -ODT) 4 MG disintegrating tablet Take 1 tablet (4 mg total) by mouth every 8 (eight) hours as needed for nausea or vomiting. 20 tablet 0   rizatriptan (MAXALT) 10 MG tablet Take by mouth.     TRI-ESTARYLLA  0.18/0.215/0.25 MG-35 MCG tablet Take 1 tablet by mouth daily. 84 tablet 1   No current facility-administered medications for this visit.    Allergies  Allergen Reactions   Iodinated Contrast Media Nausea Only   Multihance  [Gadobenate] Nausea And Vomiting    Family History  Problem Relation Age of Onset   Hypertension Mother    Healthy Father    Down syndrome Sister    Hypertension Maternal Grandmother    Hypertension Maternal Grandfather    Hypertension Paternal Grandmother    Hypertension Paternal Grandfather     Social History    Socioeconomic History   Marital status: Single    Spouse name: Not on file   Number of children: Not on file   Years of education: Not on file   Highest education level: Not on file  Occupational History   Not on file  Tobacco Use   Smoking status: Never   Smokeless tobacco: Never  Vaping Use   Vaping status: Every Day  Substance and Sexual Activity   Alcohol use: Never   Drug use: Never   Sexual activity: Yes  Other Topics Concern   Not on file  Social History Narrative   Michele Dunlap is currently in 11th grade at [employer]. She lives at home with mother and sister. She has 3 dogs. She likes to dance.   Social Drivers of Corporate Investment Banker Strain: Not on file  Food Insecurity: Not on file  Transportation Needs: Not on file  Physical Activity: Not on file  Stress: Not on file  Social Connections: Not on file  Intimate Partner Violence: Not on file     Constitutional: Patient reports intermittent headaches.  Denies fever, malaise, fatigue, or abrupt weight changes.  HEENT: Denies eye pain, eye redness, ear pain, ringing in the ears, wax buildup, runny nose, nasal congestion, bloody nose, or sore throat. Respiratory: Denies difficulty breathing, shortness of breath, cough or sputum  production.   Cardiovascular: Denies chest pain, chest tightness, palpitations or swelling in the hands or feet.  Gastrointestinal: Patient reports constipation.  Denies abdominal pain, bloating, diarrhea or blood in the stool.  GU: Denies urgency, frequency, pain with urination, burning sensation, blood in urine, odor or discharge. Musculoskeletal: Denies decrease in range of motion, difficulty with gait, muscle pain or joint pain and swelling.  Skin: Patient reports acne.  Denies redness, rashes, lesions or ulcercations.  Neurological: Patient reports inattention, insomnia and neuropathy.  Denies dizziness, difficulty with memory, difficulty with speech or problems with balance and  coordination.  Psych: Patient has a history of anxiety and depression.  Denies SI/HI.  No other specific complaints in a complete review of systems (except as listed in HPI above).     Objective:   Physical Exam  BP 124/78 (BP Location: Left Arm, Patient Position: Sitting, Cuff Size: Normal)   Ht 4' 10 (1.473 m)   Wt 159 lb 3.2 oz (72.2 kg)   LMP 02/15/2024 (Approximate)   BMI 33.27 kg/m    Wt Readings from Last 3 Encounters:  01/02/24 163 lb (73.9 kg)  10/17/23 154 lb (69.9 kg)  08/04/23 146 lb 3.2 oz (66.3 kg)    General: Appears her stated age, obese, in NAD. Skin: Warm, dry and intact.  Acne vulgaris noted to face. HEENT: Head: normal shape and size; Eyes: sclera white, no icterus, conjunctiva pink, PERRLA and EOMs intact;  Neck:  Neck supple, trachea midline. No masses, lumps present.  Thyromegaly noted without nodule. Cardiovascular: Normal rate and rhythm. S1,S2 noted.  No murmur, rubs or gallops noted. No JVD or BLE edema.  Pulmonary/Chest: Normal effort and positive vesicular breath sounds. No respiratory distress. No wheezes, rales or ronchi noted.  Abdomen: Normal bowel sounds.  Musculoskeletal: Strength 5/5 BUE/BLE. No difficulty with gait.  Neurological: Alert and oriented. Cranial nerves II-XII grossly intact. Coordination normal.  Psychiatric: Mood and affect mildly flat.  Behavior is normal. Judgment and thought content normal.    BMET    Component Value Date/Time   NA 138 02/03/2023 1111   K 3.9 02/03/2023 1111   CL 106 02/03/2023 1111   CO2 22 02/03/2023 1111   GLUCOSE 88 02/03/2023 1111   BUN 11 02/03/2023 1111   CREATININE 0.82 02/03/2023 1111   CALCIUM 9.7 02/03/2023 1111   GFRNONAA NOT CALCULATED 02/02/2018 0039   GFRAA NOT CALCULATED 02/02/2018 0039    Lipid Panel     Component Value Date/Time   CHOL 191 02/03/2023 1111   TRIG 183 (H) 02/03/2023 1111   HDL 51 02/03/2023 1111   CHOLHDL 3.7 02/03/2023 1111   LDLCALC 110 (H) 02/03/2023  1111    CBC    Component Value Date/Time   WBC 11.6 (H) 01/02/2024 1447   RBC 4.41 01/02/2024 1447   HGB 12.8 01/02/2024 1447   HCT 38.6 01/02/2024 1447   PLT 322 01/02/2024 1447   MCV 87.5 01/02/2024 1447   MCH 29.0 01/02/2024 1447   MCHC 33.2 01/02/2024 1447   RDW 12.3 01/02/2024 1447   LYMPHSABS 1.8 02/02/2018 0039   MONOABS 1.7 (H) 02/02/2018 0039   EOSABS 0.1 02/02/2018 0039   BASOSABS 0.1 02/02/2018 0039    Hgb A1C Lab Results  Component Value Date   HGBA1C 5.3 02/03/2023           Assessment & Plan:   Preventative health maintenance:  Flu shot declined Tetanus  UTD Encouraged her to get her COVID vaccine Pap smear  UTD Encouraged her to consume a balanced diet and exercise regimen Advised her to see a dentist annually Labs deferred  RTC in 6 months, follow-up chronic conditions Angeline Laura, NP

## 2024-03-04 ENCOUNTER — Telehealth: Admitting: Physician Assistant

## 2024-03-04 DIAGNOSIS — K529 Noninfective gastroenteritis and colitis, unspecified: Secondary | ICD-10-CM

## 2024-03-04 DIAGNOSIS — R110 Nausea: Secondary | ICD-10-CM

## 2024-03-04 MED ORDER — ONDANSETRON HCL 4 MG PO TABS: 4.0000 mg | ORAL_TABLET | Freq: Three times a day (TID) | ORAL | 0 refills | Status: AC | PRN

## 2024-03-04 NOTE — Progress Notes (Signed)
 Virtual Visit Consent   Michele Dunlap, you are scheduled for a virtual visit with a [facility] provider today. Just as with appointments in the office, your consent must be obtained to participate. Your consent will be active for this visit and any virtual visit you may have with one of our providers in the next 365 days. If you have a MyChart account, a copy of this consent can be sent to you electronically.  As this is a virtual visit, video technology does not allow for your provider to perform a traditional examination. This may limit your provider's ability to fully assess your condition. If your provider identifies any concerns that need to be evaluated in person or the need to arrange testing (such as labs, EKG, etc.), we will make arrangements to do so. Although advances in technology are sophisticated, we cannot ensure that it will always work on either your end or our end. If the connection with a video visit is poor, the visit may have to be switched to a telephone visit. With either a video or telephone visit, we are not always able to ensure that we have a secure connection.  By engaging in this virtual visit, you consent to the provision of healthcare and authorize for your insurance to be billed (if applicable) for the services provided during this visit. Depending on your insurance coverage, you may receive a charge related to this service.  I need to obtain your verbal consent now. Are you willing to proceed with your visit today? Michele Dunlap has provided verbal consent on 03/04/2024 for a virtual visit (video or telephone). Michele Borg, PA-C  Date: 03/04/2024 5:01 PM   Virtual Visit via Video Note   I, Michele Dunlap, connected with  Michele Dunlap  ([id number], 2000/05/12) on 03/04/24 at  4:45 PM EST by a video-enabled telemedicine application and verified that I am speaking with the correct person using two identifiers.  Location: Patient: Virtual Visit Location Patient:  Home Provider: Virtual Visit Location Provider: Home Office   I discussed the limitations of evaluation and management by telemedicine and the availability of in person appointments. The patient expressed understanding and agreed to proceed.    History of Present Illness: Michele Dunlap is a 23 y.o. who identifies as a female who was assigned female at birth, and is being seen today for nausea and diarrhea.  HPI: 23y/o F presents for a telehealth video visit for c/o slight nausea and diarrhea x few days. She missed school and requested a note. She denies any other symptoms including fever, urinary symptoms, or vomiting. Has been taking previously prescribed Zofran  which has helped.     Problems:  Patient Active Problem List   Diagnosis Date Noted   Class 1 obesity due to excess calories with body mass index (BMI) of 33.0 to 33.9 in adult 10/17/2023   ADD (attention deficit disorder) 08/18/2022   Eczema 08/18/2022   Idiopathic neuropathy 06/02/2022   Pure hypercholesterolemia 06/02/2022   Insomnia 01/10/2022   Anxiety and depression 03/18/2020   Eosinophilic esophagitis 02/12/2018   Migraine 02/27/2012    Allergies:  Allergies  Allergen Reactions   Iodinated Contrast Media Nausea Only   Multihance  [Gadobenate] Nausea And Vomiting   Medications:  Current Outpatient Medications:    ondansetron  (ZOFRAN ) 4 MG tablet, Take 1 tablet (4 mg total) by mouth every 8 (eight) hours as needed for nausea or vomiting., Disp: 20 tablet, Rfl: 0   amphetamine-dextroamphetamine (ADDERALL XR) 15 MG  24 hr capsule, Take by mouth every morning., Disp: , Rfl:    clindamycin-benzoyl peroxide (BENZACLIN) gel, Apply topically 2 (two) times daily., Disp: 25 g, Rfl: 0   clobetasol  (TEMOVATE ) 0.05 % external solution, Apply 1 Application topically 2 (two) times a week., Disp: 240 mL, Rfl: 0   lithium  300 MG tablet, Take 300 mg by mouth 2 (two) times daily., Disp: , Rfl:    lurasidone (LATUDA) 80 MG TABS  tablet, Take 80 mg by mouth daily with breakfast., Disp: , Rfl:    minocycline (MINOCIN) 100 MG capsule, Take 1 capsule (100 mg total) by mouth daily., Disp: 90 capsule, Rfl: 1   omeprazole  (PRILOSEC) 40 MG capsule, TAKE 1 CAPSULE (40 MG TOTAL) BY MOUTH DAILY., Disp: 90 capsule, Rfl: 0   ondansetron  (ZOFRAN -ODT) 4 MG disintegrating tablet, Take 1 tablet (4 mg total) by mouth every 8 (eight) hours as needed for nausea or vomiting., Disp: 20 tablet, Rfl: 0   QUEtiapine (SEROQUEL) 25 MG tablet, Take 25 mg by mouth at bedtime., Disp: , Rfl:    rizatriptan (MAXALT) 10 MG tablet, Take by mouth., Disp: , Rfl:    TRI-ESTARYLLA  0.18/0.215/0.25 MG-35 MCG tablet, Take 1 tablet by mouth daily., Disp: 84 tablet, Rfl: 1  Observations/Objective: Patient is well-developed, well-nourished in no acute distress.  Resting comfortably  at home.  Head is normocephalic, atraumatic.  No labored breathing.  Speech is clear and coherent with logical content.  Patient is alert and oriented at baseline.    Assessment and Plan: 1. Gastroenteritis (Primary)  2. Nausea - ondansetron  (ZOFRAN ) 4 MG tablet; Take 1 tablet (4 mg total) by mouth every 8 (eight) hours as needed for nausea or vomiting.  Dispense: 20 tablet; Refill: 0  Stay well hydrated with clear liquids. Take otc Imodium for diarrhea if needed. Take Zofran  as previously prescribed. A school note sent to patient portal.  Continue to watch for worsening symptoms. Schedule a virtual appointment or follow up at [facility] urgent care clinic if symptoms don't improve.  Pt verbalized understanding and in agreement.    Follow Up Instructions: I discussed the assessment and treatment plan with the patient. The patient was provided Michele opportunity to ask questions and all were answered. The patient agreed with the plan and demonstrated Michele understanding of the instructions.  A copy of instructions were sent to the patient via MyChart unless otherwise noted below.    Patient has requested to receive PHI (AVS, Work Notes, etc) pertaining to this video visit through e-mail as they are currently without active MyChart. They have voiced understand that email is not considered secure and their health information could be viewed by someone other than the patient.   The patient was advised to call back or seek [facility] in-person evaluation if the symptoms worsen or if the condition fails to improve as anticipated.    Michele Welby, PA-C

## 2024-03-04 NOTE — Patient Instructions (Signed)
 Mic[facility] Dunlap, thank you for joining Lovette Borg, PA-C for today's virtual visit.  While this provider is not your primary care provider (PCP), if your PCP is located in our provider database this encounter information will be shared with them immediately following your visit.   A [facility] MyChart account gives you access to today's visit and all your visits, tests, and labs performed at [facility]  click here if you don't have a [facility] MyChart account or go to [email or web address]  Consent: (Patient) Michele Dunlap provided verbal consent for this virtual visit at the beginning of the encounter.  Current Medications:  Current Outpatient Medications:    ondansetron  (ZOFRAN ) 4 MG tablet, Take 1 tablet (4 mg total) by mouth every 8 (eight) hours as needed for nausea or vomiting., Disp: 20 tablet, Rfl: 0   amphetamine-dextroamphetamine (ADDERALL XR) 15 MG 24 hr capsule, Take by mouth every morning., Disp: , Rfl:    clindamycin-benzoyl peroxide (BENZACLIN) gel, Apply topically 2 (two) times daily., Disp: 25 g, Rfl: 0   clobetasol  (TEMOVATE ) 0.05 % external solution, Apply 1 Application topically 2 (two) times a week., Disp: 240 mL, Rfl: 0   lithium  300 MG tablet, Take 300 mg by mouth 2 (two) times daily., Disp: , Rfl:    lurasidone (LATUDA) 80 MG TABS tablet, Take 80 mg by mouth daily with breakfast., Disp: , Rfl:    minocycline (MINOCIN) 100 MG capsule, Take 1 capsule (100 mg total) by mouth daily., Disp: 90 capsule, Rfl: 1   omeprazole  (PRILOSEC) 40 MG capsule, TAKE 1 CAPSULE (40 MG TOTAL) BY MOUTH DAILY., Disp: 90 capsule, Rfl: 0   ondansetron  (ZOFRAN -ODT) 4 MG disintegrating tablet, Take 1 tablet (4 mg total) by mouth every 8 (eight) hours as needed for nausea or vomiting., Disp: 20 tablet, Rfl: 0   QUEtiapine (SEROQUEL) 25 MG tablet, Take 25 mg by mouth at bedtime., Disp: , Rfl:    rizatriptan (MAXALT) 10 MG tablet, Take by mouth., Disp: , Rfl:    TRI-ESTARYLLA   0.18/0.215/0.25 MG-35 MCG tablet, Take 1 tablet by mouth daily., Disp: 84 tablet, Rfl: 1   Medications ordered in this encounter:  Meds ordered this encounter  Medications   ondansetron  (ZOFRAN ) 4 MG tablet    Sig: Take 1 tablet (4 mg total) by mouth every 8 (eight) hours as needed for nausea or vomiting.    Dispense:  20 tablet    Refill:  0    Supervising Provider:   LAMPTEY, PHILIP O [[id number]]     *If you need refills on other medications prior to your next appointment, please contact your pharmacy*  Follow-Up: Call back or seek an in-person evaluation if the symptoms worsen or if the condition fails to improve as anticipated.  [facility] [phone]  Other Instructions Stay well hydrated with clear liquids. Take otc Imodium for diarrhea if needed. Take Zofran  as previously prescribed. A school note sent to patient portal.  Continue to watch for worsening symptoms. Schedule a virtual appointment or follow up at an urgent care clinic if symptoms don't improve.    If you have been instructed to have an in-person evaluation today at a local Urgent Care facility, please use the link below. It will take you to a list of all of our available [facility], including address, phone number and hours of operation. Please do not delay care.  [facility]  If you or a family member do not have  a primary care provider, use the link below to schedule a visit and establish care. When you choose a [facility] primary care physician or advanced practice provider, you gain a long-term partner in health. Find a Primary Care Provider  Learn more about [facility]'s in-office and virtual care options: [facility] - Get Care Now

## 2024-03-05 ENCOUNTER — Ambulatory Visit: Admitting: Rheumatology

## 2024-03-07 ENCOUNTER — Other Ambulatory Visit: Payer: Self-pay

## 2024-03-07 DIAGNOSIS — R21 Rash and other nonspecific skin eruption: Secondary | ICD-10-CM | POA: Insufficient documentation

## 2024-03-07 DIAGNOSIS — M542 Cervicalgia: Secondary | ICD-10-CM | POA: Diagnosis present

## 2024-03-07 NOTE — ED Triage Notes (Addendum)
 POV from home for hives on chest and bilateral legs starting this morning and then also C/O of neck pain x 3 days. Denies any injuries to the neck. Denies any new foods or products. Prescribed gabapentin for the pain in her neck without relief. Hx of nerve pain. Ambulatory to triage. NAD noted.

## 2024-03-08 ENCOUNTER — Emergency Department
Admission: EM | Admit: 2024-03-08 | Discharge: 2024-03-08 | Disposition: A | Discharge status: Home / Self Care | Attending: Emergency Medicine | Admitting: Emergency Medicine

## 2024-03-08 DIAGNOSIS — M542 Cervicalgia: Secondary | ICD-10-CM

## 2024-03-08 DIAGNOSIS — R21 Rash and other nonspecific skin eruption: Secondary | ICD-10-CM

## 2024-03-08 MED ORDER — PREDNISONE 20 MG PO TABS
60.0000 mg | ORAL_TABLET | Freq: Once | ORAL | Status: AC
  Administered 2024-03-08: 60 mg via ORAL
  Filled 2024-03-08: qty 3

## 2024-03-08 MED ORDER — CYCLOBENZAPRINE HCL 10 MG PO TABS
10.0000 mg | ORAL_TABLET | Freq: Once | ORAL | Status: AC
  Administered 2024-03-08: 10 mg via ORAL
  Filled 2024-03-08: qty 1

## 2024-03-08 MED ORDER — CYCLOBENZAPRINE HCL 10 MG PO TABS: 10.0000 mg | ORAL_TABLET | Freq: Three times a day (TID) | ORAL | 0 refills | Status: AC | PRN

## 2024-03-08 MED ORDER — DIPHENHYDRAMINE HCL 25 MG PO CAPS
25.0000 mg | ORAL_CAPSULE | Freq: Once | ORAL | Status: AC
  Administered 2024-03-08: 25 mg via ORAL
  Filled 2024-03-08: qty 1

## 2024-03-08 MED ORDER — PREDNISONE 20 MG PO TABS: 40.0000 mg | ORAL_TABLET | Freq: Every day | ORAL | 0 refills | Status: AC

## 2024-03-08 NOTE — Discharge Instructions (Signed)
 You were seen in the ER today for evaluation of your rash and ongoing neck pain.  I am concerned that your rash is an allergic reaction to the gabapentin.  I do recommend discontinuing this.  You can take over-the-counter Benadryl to help with this.  I have also sent a short steroid course to your pharmacy.  In addition, I sent a prescription for a muscle relaxer to see if this helps with your neck pain.  Continue to follow-up with your outpatient team for further evaluation.  Return to the ER for new or worsening symptoms.

## 2024-03-08 NOTE — ED Provider Notes (Signed)
 [facility] Provider Note    Event Date/Time   First MD Initiated Contact with Patient 03/08/24 0008     (approximate)   History   Allergic Reaction and Neck Pain   HPI  Michele Dunlap is a 23 year old female with history of migraines, idiopathic neuropathy presenting to the emergency department for evaluation of neck pain and rash.  Patient reports that for the past few days she has had neck pain over her bilateral neck.  No injury.  No new numbness, tingling, focal weakness.  Was prescribed gabapentin which she reports was providing some benefit.  Started yesterday.  This morning noticed a rash over her chest and legs.  Does report she is taking gabapentin in the past.  No other new body products, medications, food exposures.  No reported difficulty breathing, sensation of airway closure.  Has not taken anything for her rash.     Physical Exam   Triage Vital Signs: ED Triage Vitals [03/07/24 2151]  Encounter Vitals Group     BP 125/81     Girls Systolic BP Percentile      Girls Diastolic BP Percentile      Boys Systolic BP Percentile      Boys Diastolic BP Percentile      Pulse Rate 99     Resp 18     Temp 99.7 F (37.6 C)     Temp Source Oral     SpO2 100 %     Weight      Height      Head Circumference      Peak Flow      Pain Score 7     Pain Loc      Pain Education      Exclude from Growth Chart     Most recent vital signs: Vitals:   03/07/24 2151  BP: 125/81  Pulse: 99  Resp: 18  Temp: 99.7 F (37.6 C)  SpO2: 100%     General: Awake, interactive  CV:  Good peripheral perfusion Resp:  Unlabored respirations Abd:  Nondistended.  Neuro:  Symmetric facial movement, fluid speech MSK:   No midline tenderness of the neck.  There is reproducible tenderness to palpation of the paraspinous musculature.  Negative Spurling test bilaterally.  No meningismus.  5 out of 5 strength of the bilateral upper and lower  extremities. Skin:  There is a macular rash over the chest and urticarial rash along the medial aspect of the bilateral thighs  ED Results / Procedures / Treatments   Labs (all labs ordered are listed, but only abnormal results are displayed) Labs Reviewed - No data to display   EKG EKG independently reviewed and interpreted by myself demonstrates:    RADIOLOGY Imaging independently reviewed and interpreted by myself demonstrates:   Formal Radiology Read:  No results found.  PROCEDURES:  Critical Care performed: No  Procedures   MEDICATIONS ORDERED IN ED: Medications  cyclobenzaprine (FLEXERIL) tablet 10 mg (10 mg Oral Given 03/08/24 0041)  predniSONE  (DELTASONE ) tablet 60 mg (60 mg Oral Given 03/08/24 0041)  diphenhydrAMINE (BENADRYL) capsule 25 mg (25 mg Oral Given 03/08/24 0041)     IMPRESSION / MDM / ASSESSMENT AND PLAN / ED COURSE  I reviewed the triage vital signs and the nursing notes.  Differential diagnosis includes, but is not limited to, allergic reaction, no evidence of anaphylaxis, drug reaction, musculoskeletal strain, no evidence of meningitis, acute spinal cord pathology  Patient's presentation is most consistent  with acute illness / injury with system symptoms.  23 year old female presenting with neck pain and rash.  Neurologic exam fortunately overall reassuring.  Given urticarial appearance of a portion of her rash, I am concerned about an allergic reaction, possibly to gabapentin given time course of starting this yesterday and rash development today.  With this, I did recommend the patient discontinue gabapentin.  She was ordered for prednisone  and Benadryl here.  Suspect some component of musculoskeletal strain contributing to her neck pain.  With this did recommend trialing Flexeril.  Will also DC with short prednisone  course for her rash which may also help with her neck pain.  Patient comfortable this plan.  Strict return precautions provided.   Patient discharged in stable condition.     FINAL CLINICAL IMPRESSION(S) / ED DIAGNOSES   Final diagnoses:  Neck pain  Rash     Rx / DC Orders   ED Discharge Orders          Ordered    cyclobenzaprine (FLEXERIL) 10 MG tablet  3 times daily PRN        03/08/24 0116    predniSONE  (DELTASONE ) 20 MG tablet  Daily with breakfast        03/08/24 0116             Note:  This document was prepared using Dragon voice recognition software and may include unintentional dictation errors.   Levander Slate, MD 03/08/24 [phone]

## 2024-03-18 ENCOUNTER — Other Ambulatory Visit: Payer: Self-pay | Admitting: Internal Medicine

## 2024-03-18 DIAGNOSIS — L700 Acne vulgaris: Secondary | ICD-10-CM

## 2024-03-19 NOTE — Telephone Encounter (Signed)
 Requested Prescriptions  Pending Prescriptions Disp Refills   clindamycin -benzoyl peroxide (BENZACLIN) gel [Pharmacy Med Name: CLINDAMYCIN -BENZOYL PEROX 1-5%] 25 g 0    Sig: APPLY TOPICALLY TWICE A DAY     Dermatology:  Acne preparations Passed - 03/19/2024  3:55 PM      Passed - Valid encounter within last 12 months    Recent Outpatient Visits           3 weeks ago Encounter for general adult medical examination with abnormal findings   [facility] [place], Angeline ORN, NP   2 months ago Chronic fatigue   [place] [facility] [place], Angeline ORN, NP   5 months ago Pure hypercholesterolemia   [facility] [place], Angeline ORN, NP   7 months ago Family history of systemic lupus erythematosus   [facility] [place], Angeline ORN, NP   12 years ago Pain of sternum   [facility], Harlene BROCKS, MD       Future Appointments             In 3 months Deveshwar, Maya, MD [facility] [place] [facility]

## 2024-03-29 ENCOUNTER — Other Ambulatory Visit: Payer: Self-pay | Admitting: Family Medicine

## 2024-04-01 ENCOUNTER — Other Ambulatory Visit: Payer: Self-pay | Admitting: Internal Medicine

## 2024-04-01 NOTE — Telephone Encounter (Signed)
 Requested Prescriptions  Pending Prescriptions Disp Refills   Norgestimate-Ethinyl Estradiol Triphasic (TRI-ESTARYLLA ) 0.18/0.215/0.25 MG-35 MCG tablet [Pharmacy Med Name: TRI-ESTARYLLA  TABLET] 84 tablet 3    Sig: TAKE 1 TABLET BY MOUTH EVERY DAY     OB/GYN:  Contraceptives Passed - 04/01/2024  1:36 PM      Passed - Last BP in normal range    BP Readings from Last 1 Encounters:  03/08/24 127/88         Passed - Valid encounter within last 12 months    Recent Outpatient Visits           1 month ago Encounter for general adult medical examination with abnormal findings   [place] [facility] [place], Angeline ORN, NP   3 months ago Chronic fatigue   [facility] [place], Angeline ORN, NP   5 months ago Pure hypercholesterolemia   [facility] [place], Angeline ORN, NP   8 months ago Family history of systemic lupus erythematosus   [place] [facility] [place], Angeline ORN, NP   12 years ago Pain of sternum   [facility] at [facility], Harlene BROCKS, MD       Future Appointments             In 3 months Deveshwar, Maya, MD [facility] Rheumatology - A Dept Of [place]. [facility]            Passed - Patient is not a smoker

## 2024-04-02 ENCOUNTER — Encounter: Payer: Self-pay | Admitting: Internal Medicine

## 2024-04-03 NOTE — Telephone Encounter (Signed)
 Duplicate request, too soon for refill.  Requested Prescriptions  Pending Prescriptions Disp Refills   Norgestimate-Ethinyl Estradiol Triphasic (TRI-ESTARYLLA ) 0.18/0.215/0.25 MG-35 MCG tablet 84 tablet 3    Sig: Take 1 tablet by mouth daily.     OB/GYN:  Contraceptives Passed - 04/03/2024  1:02 PM      Passed - Last BP in normal range    BP Readings from Last 1 Encounters:  03/08/24 127/88         Passed - Valid encounter within last 12 months    Recent Outpatient Visits           1 month ago Encounter for general adult medical examination with abnormal findings   [place] [facility] [place], Angeline ORN, NP   3 months ago Chronic fatigue   [place] [facility] [place], Angeline ORN, NP   5 months ago Pure hypercholesterolemia   [place] [facility], Angeline ORN, NP   8 months ago Family history of systemic lupus erythematosus   [place] [facility] [place], Angeline ORN, NP   12 years ago Pain of sternum   Primary [facility] at [facility], Harlene BROCKS, MD       Future Appointments             In 3 months Deveshwar, Maya, MD [facility] Rheumatology - A Dept Of [place]. [facility]            Passed - Patient is not a smoker

## 2024-04-08 ENCOUNTER — Other Ambulatory Visit: Payer: Self-pay | Admitting: Family Medicine

## 2024-04-08 ENCOUNTER — Other Ambulatory Visit: Payer: Self-pay | Admitting: Internal Medicine

## 2024-04-08 DIAGNOSIS — E7800 Pure hypercholesterolemia, unspecified: Secondary | ICD-10-CM

## 2024-04-09 ENCOUNTER — Other Ambulatory Visit: Payer: Self-pay

## 2024-04-09 ENCOUNTER — Encounter: Payer: Self-pay | Admitting: Internal Medicine

## 2024-04-09 MED ORDER — NORGESTIM-ETH ESTRAD TRIPHASIC 0.18/0.215/0.25 MG-35 MCG PO TABS: 1.0000 | ORAL_TABLET | Freq: Every day | ORAL | 3 refills | Status: AC

## 2024-04-11 NOTE — Telephone Encounter (Signed)
 Duplicate request.  Requested Prescriptions  Pending Prescriptions Disp Refills   TRI-ESTARYLLA  0.18/0.215/0.25 MG-35 MCG tablet [Pharmacy Med Name: TRI-ESTARYLLA  TABLET] 84 tablet 3    Sig: TAKE 1 TABLET BY MOUTH EVERY DAY     OB/GYN:  Contraceptives Passed - 04/11/2024  1:35 PM      Passed - Last BP in normal range    BP Readings from Last 1 Encounters:  03/08/24 127/88         Passed - Valid encounter within last 12 months    Recent Outpatient Visits           1 month ago Encounter for general adult medical examination with abnormal findings   [place] [facility] [place], Angeline ORN, NP   3 months ago Chronic fatigue   [place] [facility] [place], Angeline ORN, NP   5 months ago Pure hypercholesterolemia   [place] [facility] [place], Angeline ORN, NP   8 months ago Family history of systemic lupus erythematosus    [facility] [place], Angeline ORN, NP   12 years ago Pain of sternum   [facility] at [facility], Harlene BROCKS, MD       Future Appointments             In 3 months Deveshwar, Maya, MD [facility] Rheumatology - A Dept Of [place]. [facility]            Passed - Patient is not a smoker

## 2024-04-11 NOTE — Telephone Encounter (Signed)
 Requested by interface [email or web address]. Signed 04/09/24. Duplicate request. Receipt confirmed by pharmacy 04/09/24 at 2:44 pm. Requested Prescriptions  Refused Prescriptions Disp Refills   TRI-ESTARYLLA  0.18/0.215/0.25 MG-35 MCG tablet [Pharmacy Med Name: TRI-ESTARYLLA  TABLET] 84 tablet 1    Sig: TAKE 1 TABLET BY MOUTH EVERY DAY     OB/GYN:  Contraceptives Passed - 04/11/2024  1:34 PM      Passed - Last BP in normal range    BP Readings from Last 1 Encounters:  03/08/24 127/88         Passed - Valid encounter within last 12 months    Recent Outpatient Visits           1 month ago Encounter for general adult medical examination with abnormal findings   [place] [place], Angeline ORN, NP   3 months ago Chronic fatigue   [place] [facility] [place], Angeline ORN, NP   5 months ago Pure hypercholesterolemia   [facility] [place], Angeline ORN, NP   8 months ago Family history of systemic lupus erythematosus   [place] [facility] [place], Angeline ORN, NP   12 years ago Pain of sternum   [facility] at [facility], Harlene BROCKS, MD       Future Appointments             In 3 months Deveshwar, Maya, MD [facility] Rheumatology - A Dept Of [place]. [facility]            Passed - Patient is not a smoker

## 2024-04-21 ENCOUNTER — Other Ambulatory Visit: Payer: Self-pay | Admitting: Internal Medicine

## 2024-04-21 DIAGNOSIS — L700 Acne vulgaris: Secondary | ICD-10-CM

## 2024-04-23 NOTE — Telephone Encounter (Signed)
 Requested Prescriptions  Pending Prescriptions Disp Refills   clindamycin -benzoyl peroxide (BENZACLIN) gel [Pharmacy Med Name: CLINDAMYCIN -BENZOYL PEROX 1-5%] 25 g 0    Sig: APPLY TOPICALLY TWICE A DAY     Dermatology:  Acne preparations Passed - 04/23/2024  1:17 PM      Passed - Valid encounter within last 12 months    Recent Outpatient Visits           1 month ago Encounter for general adult medical examination with abnormal findings   [place] [facility] [place], Angeline ORN, NP   3 months ago Chronic fatigue   [place] [facility] [place], Angeline ORN, NP   6 months ago Pure hypercholesterolemia   [place] [facility] [place], Angeline ORN, NP   8 months ago Family history of systemic lupus erythematosus   [facility] [place], Angeline ORN, NP   12 years ago Pain of sternum   [facility] at [facility], Harlene BROCKS, MD       Future Appointments             In 2 months Deveshwar, Maya, MD [facility] Rheumatology - A Dept Of [place]. [facility]

## 2024-05-07 ENCOUNTER — Encounter: Admitting: Internal Medicine

## 2024-05-18 ENCOUNTER — Other Ambulatory Visit: Payer: Self-pay | Admitting: Internal Medicine

## 2024-05-20 NOTE — Telephone Encounter (Signed)
 Requested Prescriptions  Pending Prescriptions Disp Refills   omeprazole  (PRILOSEC) 40 MG capsule [Pharmacy Med Name: OMEPRAZOLE  DR 40 MG CAPSULE] 90 capsule 0    Sig: TAKE 1 CAPSULE (40 MG TOTAL) BY MOUTH DAILY.     Gastroenterology: Proton Pump Inhibitors Passed - 05/20/2024 11:36 AM      Passed - Valid encounter within last 12 months    Recent Outpatient Visits           2 months ago Encounter for general adult medical examination with abnormal findings   [place] [facility] [place], Angeline ORN, NP   4 months ago Chronic fatigue   [place] [facility] [place], Angeline ORN, NP   7 months ago Pure hypercholesterolemia   [place] [facility] [place], Angeline ORN, NP   9 months ago Family history of systemic lupus erythematosus   [facility] [place], Angeline ORN, NP   12 years ago Pain of sternum   [facility] at [facility], Harlene BROCKS, MD       Future Appointments             In 1 month Deveshwar, Maya, MD [facility] Rheumatology - A Dept Of [place]. [facility]

## 2024-05-29 ENCOUNTER — Other Ambulatory Visit (HOSPITAL_COMMUNITY): Payer: Self-pay

## 2024-06-21 ENCOUNTER — Encounter: Admitting: Rheumatology

## 2024-07-16 ENCOUNTER — Ambulatory Visit: Admitting: Rheumatology

## 2024-08-26 ENCOUNTER — Ambulatory Visit: Admitting: Internal Medicine
